# Patient Record
Sex: Female | Born: 1975 | Race: Black or African American | Hispanic: No | Marital: Single | Smoking: Current every day smoker
Health system: Southern US, Community
[De-identification: ages and names within clinical notes are randomized; demographics above are authoritative.]

## PROBLEM LIST (undated history)

## (undated) DIAGNOSIS — L509 Urticaria, unspecified: Secondary | ICD-10-CM

## (undated) DIAGNOSIS — T783XXA Angioneurotic edema, initial encounter: Secondary | ICD-10-CM

## (undated) DIAGNOSIS — L309 Dermatitis, unspecified: Secondary | ICD-10-CM

## (undated) HISTORY — PX: HAMMER TOE SURGERY: SHX385

## (undated) HISTORY — DX: Urticaria, unspecified: L50.9

## (undated) HISTORY — DX: Dermatitis, unspecified: L30.9

## (undated) HISTORY — PX: HERNIA REPAIR: SHX51

## (undated) HISTORY — PX: MYOMECTOMY: SHX85

## (undated) HISTORY — DX: Angioneurotic edema, initial encounter: T78.3XXA

---

## 1999-07-25 ENCOUNTER — Inpatient Hospital Stay (HOSPITAL_COMMUNITY): Admission: AD | Admit: 1999-07-25 | Discharge: 1999-07-25 | Payer: Self-pay | Admitting: *Deleted

## 2002-06-01 ENCOUNTER — Encounter: Admission: RE | Admit: 2002-06-01 | Discharge: 2002-06-01 | Payer: Self-pay | Admitting: Family Medicine

## 2002-06-01 ENCOUNTER — Encounter: Payer: Self-pay | Admitting: Family Medicine

## 2002-08-19 ENCOUNTER — Other Ambulatory Visit: Admission: RE | Admit: 2002-08-19 | Discharge: 2002-08-19 | Payer: Self-pay | Admitting: Family Medicine

## 2002-09-17 ENCOUNTER — Encounter: Payer: Self-pay | Admitting: Family Medicine

## 2002-09-17 ENCOUNTER — Encounter: Admission: RE | Admit: 2002-09-17 | Discharge: 2002-09-17 | Payer: Self-pay | Admitting: Family Medicine

## 2005-06-18 ENCOUNTER — Emergency Department (HOSPITAL_COMMUNITY): Admission: EM | Admit: 2005-06-18 | Discharge: 2005-06-18 | Payer: Self-pay | Admitting: Emergency Medicine

## 2005-08-30 ENCOUNTER — Encounter: Admission: RE | Admit: 2005-08-30 | Discharge: 2005-08-30 | Payer: Self-pay | Admitting: Internal Medicine

## 2012-07-25 ENCOUNTER — Ambulatory Visit: Payer: Self-pay | Admitting: Internal Medicine

## 2012-07-25 LAB — RAPID STREP-A WITH REFLX: Micro Text Report: NEGATIVE

## 2012-07-28 LAB — BETA STREP CULTURE(ARMC)

## 2018-09-18 ENCOUNTER — Other Ambulatory Visit: Payer: Self-pay

## 2018-09-18 ENCOUNTER — Ambulatory Visit: Payer: Managed Care, Other (non HMO) | Admitting: Urgent Care

## 2018-09-18 ENCOUNTER — Encounter: Payer: Self-pay | Admitting: Urgent Care

## 2018-09-18 VITALS — BP 130/76 | HR 61 | Temp 98.3°F | Resp 18 | Ht 65.0 in | Wt 159.6 lb

## 2018-09-18 DIAGNOSIS — J3089 Other allergic rhinitis: Secondary | ICD-10-CM

## 2018-09-18 DIAGNOSIS — H1013 Acute atopic conjunctivitis, bilateral: Secondary | ICD-10-CM | POA: Diagnosis not present

## 2018-09-18 MED ORDER — METHYLPREDNISOLONE ACETATE 80 MG/ML IJ SUSP
80.0000 mg | Freq: Once | INTRAMUSCULAR | Status: AC
  Administered 2018-09-18: 80 mg via INTRAMUSCULAR

## 2018-09-18 MED ORDER — AZELASTINE HCL 0.05 % OP SOLN: 1.0000 [drp] | Freq: Two times a day (BID) | OPHTHALMIC | 12 refills | Status: DC

## 2018-09-18 MED ORDER — LEVOCETIRIZINE DIHYDROCHLORIDE 5 MG PO TABS: 5.0000 mg | ORAL_TABLET | Freq: Every evening | ORAL | 3 refills | Status: DC

## 2018-09-18 NOTE — Progress Notes (Signed)
  MRN: [id number] DOB: 20-Jul-1976  Subjective:   Alexis Santana is a 42 y.o. female presenting for week history of persistent bilateral eye itching, eyelid swelling.  Patient has long-standing history of difficult to control allergies for most of her life.  Has had difficulty with hives in the past but is off the case this time around.  Denies eye pain, eye drainage, fever, sinus pain, ear pain, throat pain, cough.  Patient has recently moved back from [place].  Since she got back she has had severe allergies of her eyes.  She did go to an ophthalmologist that prescribed prednisolone eyedrops.  Patient reports that this did not help.  She continues to take Allegra which is helped tremendously in the past but is not helping this time around.  She does smoke 1 to 2 packs/week.  Has occasional alcohol drink.   Current Outpatient Medications:  .  fexofenadine (ALLEGRA) 180 MG tablet, Take by mouth., Disp: , Rfl:    No Known Allergies  Has past medical history of allergies, anxiety.  History of a myomectomy.  Objective:   Vitals: BP 130/76   Pulse 61   Temp 98.3 F (36.8 C) (Oral)   Resp 18   Ht 5\' 5"  (1.651 m)   Wt 159 lb 9.6 oz (72.4 kg)   LMP 08/22/2018   SpO2 100%   BMI 26.56 kg/m   Physical Exam  Constitutional: She is oriented to person, place, and time. She appears well-developed and well-nourished.  HENT:  Right Ear: Tympanic membrane normal.  Left Ear: Tympanic membrane normal.  Nose: No sinus tenderness.  Mouth/Throat: Oropharynx is clear and moist.  Eyes: Pupils are equal, round, and reactive to light. EOM are normal. Right eye exhibits no chemosis, no discharge, no exudate and no hordeolum. No foreign body present in the right eye. Left eye exhibits no chemosis, no discharge, no exudate and no hordeolum. No foreign body present in the left eye. Right conjunctiva is not injected. Right conjunctiva has no hemorrhage. Left conjunctiva is not injected. Left conjunctiva has no  hemorrhage. No scleral icterus.  Patient has puffy and violaceous eyelids bilaterally.  Cardiovascular: Normal rate.  Pulmonary/Chest: Effort normal.  Neurological: She is alert and oriented to person, place, and time.   Assessment and Plan :   Allergic conjunctivitis of both eyes - Plan: methylPREDNISolone acetate (DEPO-MEDROL) injection 80 mg  Allergic rhinitis due to other allergic trigger, unspecified seasonality - Plan: methylPREDNISolone acetate (DEPO-MEDROL) injection 80 mg  Will use IM Depo-Medrol, antihistamine eyedrop and switch to Xyzal.  Counseled patient on management of persistent allergies.  Return to clinic precautions reviewed.

## 2018-09-18 NOTE — Patient Instructions (Addendum)
Opcon A       If you have lab work done today you will be contacted with your lab results within the next 2 weeks.  If you have not heard from [place] then please contact us. The fastest way to get your results is to register for My Chart.   IF you received an x-ray today, you will receive an invoice from [facility]. Please contact [facility] at [phone] with questions or concerns regarding your invoice.   IF you received labwork today, you will receive an invoice from [place]. Please contact [facility] at [phone] with questions or concerns regarding your invoice.   Our billing staff will not be able to assist you with questions regarding bills from these companies.  You will be contacted with the lab results as soon as they are available. The fastest way to get your results is to activate your My Chart account. Instructions are located on the last page of this paperwork. If you have not heard from [place] regarding the results in 2 weeks, please contact this office.

## 2018-09-19 ENCOUNTER — Telehealth: Payer: Self-pay | Admitting: Urgent Care

## 2018-09-19 NOTE — Telephone Encounter (Unsigned)
Copied from [place] [id number]. Topic: Quick Communication - See Telephone Encounter >> Sep 19, 2018  2:17 PM [name] wrote: CRM for notification. See Telephone encounter for: 09/19/18  Pt called in and stated that she was seen yesterday and stated that she is feeling the exact same.  She seem to think that non of the meds are helping.  She would like to know what else can be done or what is the next step?  Her eyes are still itching and swelling with meds  [place] on [place] number  [phone]

## 2018-10-16 ENCOUNTER — Other Ambulatory Visit: Payer: Self-pay

## 2018-10-16 ENCOUNTER — Encounter: Payer: Self-pay | Admitting: Physician Assistant

## 2018-10-16 ENCOUNTER — Ambulatory Visit: Payer: Managed Care, Other (non HMO) | Admitting: Physician Assistant

## 2018-10-16 ENCOUNTER — Telehealth: Payer: Self-pay | Admitting: Physician Assistant

## 2018-10-16 VITALS — BP 114/66 | HR 69 | Temp 99.0°F | Resp 16 | Ht 65.0 in | Wt 160.2 lb

## 2018-10-16 DIAGNOSIS — H02849 Edema of unspecified eye, unspecified eyelid: Secondary | ICD-10-CM | POA: Diagnosis not present

## 2018-10-16 DIAGNOSIS — J302 Other seasonal allergic rhinitis: Secondary | ICD-10-CM

## 2018-10-16 MED ORDER — DESONIDE 0.05 % EX CREA: TOPICAL_CREAM | Freq: Two times a day (BID) | CUTANEOUS | 0 refills | Status: DC

## 2018-10-16 MED ORDER — MONTELUKAST SODIUM 10 MG PO TABS: 10.0000 mg | ORAL_TABLET | Freq: Every day | ORAL | 6 refills | Status: DC

## 2018-10-16 MED ORDER — PREDNISONE 20 MG PO TABS: ORAL_TABLET | ORAL | 0 refills | Status: DC

## 2018-10-16 NOTE — Patient Instructions (Addendum)
Start taking Singulair.  If no improvemet start the prednisone.  Apply dapsone 1-2 times/day. Do not get this in your eyes. Do not apply for > 2 weeks.   Try claritin-D (you have to buy this from your pharmacist) Try acupuncture [facility] or [place])  Montelukast oral tablets What is this medicine? MONTELUKAST (mon te LOO kast) is used to prevent and treat the symptoms of asthma. It is also used to treat allergies. Do not use for an acute asthma attack. This medicine may be used for other purposes; ask your health care provider or pharmacist if you have questions. COMMON BRAND NAME(S): Singulair What should I tell my health care provider before I take this medicine? They need to know if you have any of these conditions: -liver disease -an unusual or allergic reaction to montelukast, other medicines, foods, dyes, or preservatives -pregnant or trying to get pregnant -breast-feeding How should I use this medicine? This medicine should be given by mouth. Follow the directions on the prescription label. Take this medicine at the same time every day. You may take this medicine with or without meals. Do not chew the tablets. Do not stop taking your medicine unless your doctor tells you to. Talk to your pediatrician regarding the use of this medicine in children. Special care may be needed. While this drug may be prescribed for children as young as 82 years of age for selected conditions, precautions do apply. Overdosage: If you think you have taken too much of this medicine contact a poison control center or emergency room at once. NOTE: This medicine is only for you. Do not share this medicine with others. What if I miss a dose? If you miss a dose, take it as soon as you can. If it is almost time for your next dose, take only that dose. Do not take double or extra doses. What may interact with this medicine? -anti-infectives like rifampin and rifabutin -medicines for diabetes like  rosiglitazone and repaglinide -medicines for seizures like phenytoin, phenobarbital, and carbamazepine -paclitaxel This list may not describe all possible interactions. Give your health care provider a list of all the medicines, herbs, non-prescription drugs, or dietary supplements you use. Also tell them if you smoke, drink alcohol, or use illegal drugs. Some items may interact with your medicine. What should I watch for while using this medicine? Visit your doctor or health care professional for regular checks on your progress. Tell your doctor or health care professional if your allergy or asthma symptoms do not improve. Take your medicine even when you do not have symptoms. Do not stop taking any of your medicine(s) unless your doctor tells you to. If you have asthma, talk to your doctor about what to do in an acute asthma attack. Always have your inhaled rescue medicine for asthma attacks with you. Patients and their families should watch for new or worsening thoughts of suicide or depression. Also watch for sudden changes in feelings such as feeling anxious, agitated, panicky, irritable, hostile, aggressive, impulsive, severely restless, overly excited and hyperactive, or not being able to sleep. Any worsening of mood or thoughts of suicide or dying should be reported to your health care professional right away. What side effects may I notice from receiving this medicine? Side effects that you should report to your doctor or health care professional as soon as possible: -allergic reactions like skin rash or hives, or swelling of the face, lips, or tongue -breathing problems -confusion -dark urine -fever or infection -  flu-like symptoms -hallucinations -painful lumps under the skin -pain, tingling, numbness in the hands or feet -sinus pain or swelling -suicidal thoughts or other mood changes -tremors -trouble sleeping -uncontrolled muscle movements -unusual bleeding or  bruising -yellowing of the eyes or skin Side effects that usually do not require medical attention (report to your doctor or health care professional if they continue or are bothersome): -cough -dizziness -drowsiness -headache -nightmares -stomach upset -stuffy nose This list may not describe all possible side effects. Call your doctor for medical advice about side effects. You may report side effects to FDA at 1-800-FDA-1088. Where should I keep my medicine? Keep out of the reach of children. Store at room temperature between 15 and 30 degrees C (59 and 86 degrees F). Protect from light and moisture. Keep this medicine in the original bottle. Throw away any unused medicine after the expiration date. NOTE: This sheet is a summary. It may not cover all possible information. If you have questions about this medicine, talk to your doctor, pharmacist, or health care provider.  2018 Elsevier/Gold Standard (2015-12-12 09:40:44)   IF you received an x-ray today, you will receive an invoice from [facility]. Please contact [facility] at [phone] with questions or concerns regarding your invoice.   IF you received labwork today, you will receive an invoice from [place]. Please contact [facility] at [phone] with questions or concerns regarding your invoice.   Our billing staff will not be able to assist you with questions regarding bills from these companies.  You will be contacted with the lab results as soon as they are available. The fastest way to get your results is to activate your My Chart account. Instructions are located on the last page of this paperwork. If you have not heard from [place] regarding the results in 2 weeks, please contact this office.

## 2018-10-16 NOTE — Progress Notes (Signed)
   Alexis Santana  MRN: [id number] DOB: 07-22-1976  PCP: Patient, No Pcp Per  Subjective:  Pt is a 42 year old female who presents to clinic for swelling of her eyes x 1 month.   She was here 9/26 c/o of persistent bilateral eye itching, eyelid swelling and dx with allergic conjunctivitis. Depo-medrol IM administered and advised antihistamine eye drops and to switch to Xyzal. None of this worked.   Eyes are still puffy. Puffiness is worse at night before bed and in the morning. She is taking otc antihistamine bid. Not working.   She has h/o difficulty to control allergies.  Historically she has had hives - which has been controlled by Allegra. Allegra no longer working.  Never tried Singulair.  She has allergist appt Nov 21. Never been evaluated by allergy.   Review of Systems  Eyes: Positive for itching. Negative for photophobia, pain, discharge, redness and visual disturbance.  Allergic/Immunologic: Positive for environmental allergies.    There are no active problems to display for this patient.   Current Outpatient Medications on File Prior to Visit  Medication Sig Dispense Refill  . fexofenadine (ALLEGRA) 180 MG tablet Take by mouth.    . levocetirizine (XYZAL) 5 MG tablet Take 1 tablet (5 mg total) by mouth every evening. 90 tablet 3  . azelastine (OPTIVAR) 0.05 % ophthalmic solution Place 1 drop into both eyes 2 (two) times daily. (Patient not taking: Reported on 10/16/2018) 6 mL 12   No current facility-administered medications on file prior to visit.     No Known Allergies   Objective:  BP 114/66 (BP Location: Left Arm, Patient Position: Sitting, Cuff Size: Normal)   Pulse 69   Temp 99 F (37.2 C) (Oral)   Resp 16   Ht 5\' 5"  (1.651 m)   Wt 160 lb 3.2 oz (72.7 kg)   SpO2 100%   BMI 26.66 kg/m   Physical Exam  Constitutional: She appears well-developed and well-nourished.  Eyes:  Dry scaly skin of b/l eyelids and skin below eyebrows. L>R.  Skin: Skin is  warm and dry.  Psychiatric: She has a normal mood and affect. Thought content normal.  Vitals reviewed.   Assessment and Plan :  1. Seasonal allergies - montelukast (SINGULAIR) 10 MG tablet; Take 1 tablet (10 mg total) by mouth at bedtime.  Dispense: 30 tablet; Refill: 6  2. Edema of eyelid, unspecified laterality - predniSONE (DELTASONE) 20 MG tablet; Take 3 PO QAM x2days, 2 PO QAM x2days, 1 PO QAM x2days  Dispense: 18 tablet; Refill: 0 - desonide (DESOWEN) 0.05 % cream; Apply topically 2 (two) times daily.  Dispense: 30 g; Refill: 0    Whitney Rozelle Caudle, PA-C  [facility] 10/16/2018 4:28 PM  Please note: Portions of this report may have been transcribed using dragon voice recognition software. Every effort was made to ensure accuracy; however, inadvertent computerized transcription errors may be present.

## 2018-10-16 NOTE — Telephone Encounter (Signed)
Copied from [place] [id number]. Topic: Quick Communication - See Telephone Encounter >> Oct 16, 2018  5:11 PM Blase Mess A wrote: CRM for notification. See Telephone encounter for: 10/16/18.  [place] [phone] is calling regarding predniSONE (DELTASONE) 20 MG tablet [[id number]] regarding the instructions on how to take the medicine. Please advise thanks

## 2018-10-16 NOTE — Telephone Encounter (Signed)
Pt called in today, she says she would like to have a call back, because no one followed up with her about her previous request sent on 09/19/18.

## 2018-10-17 NOTE — Telephone Encounter (Signed)
Call from pharmacy re Prednisone.  Titration ordered equals #12 but #18 ordered.  Standard titration for this dx is 3 PO x 3 days, 2 PO x 2 days, 1 PO x 3 days which equals #18.  Ok'ed change to standard titration per pharmacy recommendation.

## 2018-10-30 ENCOUNTER — Ambulatory Visit: Payer: Managed Care, Other (non HMO) | Admitting: Allergy & Immunology

## 2018-11-13 ENCOUNTER — Encounter: Payer: Self-pay | Admitting: Allergy

## 2018-11-13 ENCOUNTER — Ambulatory Visit (INDEPENDENT_AMBULATORY_CARE_PROVIDER_SITE_OTHER): Payer: 59 | Admitting: Allergy

## 2018-11-13 VITALS — BP 104/64 | HR 72 | Temp 99.2°F | Resp 18 | Ht 64.5 in | Wt 160.0 lb

## 2018-11-13 DIAGNOSIS — T783XXD Angioneurotic edema, subsequent encounter: Secondary | ICD-10-CM | POA: Diagnosis not present

## 2018-11-13 DIAGNOSIS — L2089 Other atopic dermatitis: Secondary | ICD-10-CM

## 2018-11-13 DIAGNOSIS — L508 Other urticaria: Secondary | ICD-10-CM

## 2018-11-13 MED ORDER — CARBINOXAMINE MALEATE 6 MG PO TABS: 1.0000 | ORAL_TABLET | Freq: Two times a day (BID) | ORAL | 5 refills | Status: DC

## 2018-11-13 MED ORDER — FAMOTIDINE 20 MG PO TABS: 20.0000 mg | ORAL_TABLET | Freq: Two times a day (BID) | ORAL | 5 refills | Status: DC

## 2018-11-13 NOTE — Progress Notes (Signed)
New Patient Note  RE: Alexis Santana MRN: [id number] DOB: 04-Aug-1976 Date of Office Visit: 11/13/2018  Referring provider: No ref. provider found Primary care provider: Patient, No Pcp Per  Chief Complaint: hives  History of present illness: Alexis Santana is a 42 y.o. female presenting today for evaluation of hives.   She reports having hives and swelling for 20+ years.  She states her mother reported to her she would have hives as a child.  She also reports having eye and facial swelling episodes.  She recalls in her 15s she would have near daily episodes of lip and eye swelling.  At that time she was taking prescription Allegra until it went OTC.  Allegra was working well for her in controlling hives and swelling.  She states around 2010 she lived in [place] and she was having swelling episodes while there.  2011 she moved to [place] and continued to have swelling episodes even during colder weather; states previously the hives and swelling would occur mainly during warmer weather months.   She moved to [place] this fall and is living in a home built in the 1940s and states the hives have returned and occur almost daily for weeks at a time.  She has been hive free for the past 4-5 days.    The hives last less then 24 hours at a time and if she takes antihistamine at onset hives are resolving in 2 hours or less.  Hives do not leave any marks/bruising once resolved.  Denies any fevers or joint aches/pains with hives and swelling.  Does not recall any preceding illnesses, no change in medications, no stings, no change soaps/lotions/detergents.  She reports foods have never worsened or triggered symptoms but she does recently state after eating shrimp her mouth felt itchy.    Since moving back to [place] she does feel that the Allegra has not been effective as it use to be.  She saw UC for eye swelling and was recommended to try Xyzal which she does not feel helped.  Also prescribed singulair which she  does feel has helped.  She also received steroid injection at [facility] in [facility].    She does report history of eczema that occurs mostly during winter.  She does have clobetasol to use as needed which she states is the only topical steroid that works for her.    No history of food allergy, no history of asthma and denies symptoms for allergic rhinitis or conjunctivitis.    Review of systems: Review of Systems  Constitutional: Negative for chills, fever and malaise/fatigue.  HENT: Negative for congestion, ear discharge, nosebleeds and sore throat.   Eyes: Negative for pain, discharge and redness.  Respiratory: Negative for cough, shortness of breath and wheezing.   Cardiovascular: Negative for chest pain.  Gastrointestinal: Negative for abdominal pain, constipation, diarrhea, heartburn, nausea and vomiting.  Musculoskeletal: Negative for joint pain.  Skin: Positive for itching and rash.  Neurological: Negative for headaches.    All other systems negative unless noted above in HPI  Past medical history: Past Medical History:  Diagnosis Date  . Angio-edema   . Eczema   . Urticaria     Past surgical history: Past Surgical History:  Procedure Laterality Date  . HAMMER TOE SURGERY    . HERNIA REPAIR    . MYOMECTOMY      Family history:  Family History  Problem Relation Age of Onset  . Hypertension Mother   . Cancer  Mother        breast   . Diabetes Father     Social history: Lives in a home but states will be moving to [place].  The home has carpeting with electric heating and central cooling. No pets in the home.  She works in administration.   Tobacco Use  . Smoking status: Current Every Day Smoker - menthol 1 pk/week since 2005  . Smokeless tobacco: Never Used    Medication List: Allergies as of 11/13/2018   No Known Allergies     Medication List        Accurate as of 11/13/18 11:17 AM. Always use your most recent med list.          levocetirizine 5  MG tablet Commonly known as:  XYZAL Take 1 tablet (5 mg total) by mouth every evening.   montelukast 10 MG tablet Commonly known as:  SINGULAIR Take 1 tablet (10 mg total) by mouth at bedtime.       Known medication allergies: No Known Allergies   Physical examination: Blood pressure 104/64, pulse 72, temperature 99.2 F (37.3 C), temperature source Oral, resp. rate 18, height 5' 4.5" (1.638 m), weight 160 lb (72.6 kg), SpO2 99 %.  General: Alert, interactive, in no acute distress. HEENT: PERRLA, TMs pearly gray, turbinates non-edematous without discharge, post-pharynx non erythematous. Neck: Supple without lymphadenopathy. Lungs: Clear to auscultation without wheezing, rhonchi or rales. {no increased work of breathing. CV: Normal S1, S2 without murmurs. Abdomen: Nondistended, nontender. Skin: Warm and dry, without lesions or rashes. Extremities:  No clubbing, cyanosis or edema. Neuro:   Grossly intact.  Diagnositics/Labs: None today  Assessment and plan: Chronic urticaria with angioedema  - at this time etiology of hives and swelling is unknown.  Hives can be caused by a variety of different triggers including illness/infection, foods, medications, stings, exercise, pressure, vibrations, extremes of temperature to name a few however majority of the time there is no identifiable trigger.  Your symptoms have been ongoing for >6 weeks making this chronic thus will obtain labwork to evaluate: CBC w diff, CMP, tryptase, hive panel, environmental panel and shellfish panel.   - recommend starting high-dose antihistamine regimen: Ryvent 6mg  1 tablet twice a day (this is antihistamine like Allegra); Pepcid 20mg  1 tablet twice a day and continue Singulair 10mg  1 tablet daily  - we did discuss Xolair monthly injections to help control hives and swelling better if medication management is not effective.  Provided with brochure for Xolair  - let us know if you develop any fevers with hives  and swelling; if hives and swelling leave any marks/bruising behind once resolved or if you have any joint aches/pains with hives and swelling  Eczema  - continue as needed use of clobetasol with flares  - daily moisturization  Follow-up 2-3 months or sooner if needed  I appreciate the opportunity to take part in Alexis Santana's care. Please do not hesitate to contact me with questions.  Sincerely,   Prudy Feeler, MD Allergy/Immunology [facility]

## 2018-11-13 NOTE — Patient Instructions (Addendum)
Hives and swelling  - at this time etiology of hives and swelling is unknown.  Hives can be caused by a variety of different triggers including illness/infection, foods, medications, stings, exercise, pressure, vibrations, extremes of temperature to name a few however majority of the time there is no identifiable trigger.  Your symptoms have been ongoing for >6 weeks making this chronic thus will obtain labwork to evaluate: CBC w diff, CMP, tryptase, hive panel, environmental panel and shellfish panel.   - recommend starting high-dose antihistamine regimen: Ryvent 6mg  1 tablet twice a day (this is antihistamine like Allegra); Pepcid 20mg  1 tablet twice a day and continue Singulair 10mg  1 tablet daily  - we did discuss Xolair monthly injections to help control hives and swelling better if medication management is not effective.  Provided with brochure for Xolair  - let us know if you develop any fevers with hives and swelling; if hives and swelling leave any marks/bruising behind once resolved or if you have any joint aches/pains with hives and swelling  Eczema  - continue as needed use of clobetasol with flares  - daily moisturization  Follow-up 2-3 months or sooner if needed

## 2018-11-19 LAB — COMPREHENSIVE METABOLIC PANEL
ALT: 8 IU/L (ref 0–32)
AST: 15 IU/L (ref 0–40)
Albumin/Globulin Ratio: 2.1 (ref 1.2–2.2)
Albumin: 4.7 g/dL (ref 3.5–5.5)
Alkaline Phosphatase: 53 IU/L (ref 39–117)
BUN/Creatinine Ratio: 19 (ref 9–23)
BUN: 13 mg/dL (ref 6–24)
Bilirubin Total: 0.3 mg/dL (ref 0.0–1.2)
CALCIUM: 9.4 mg/dL (ref 8.7–10.2)
CO2: 21 mmol/L (ref 20–29)
CREATININE: 0.7 mg/dL (ref 0.57–1.00)
Chloride: 101 mmol/L (ref 96–106)
GFR calc Af Amer: 124 mL/min/{1.73_m2} (ref >59)
GFR, EST NON AFRICAN AMERICAN: 107 mL/min/{1.73_m2} (ref >59)
GLUCOSE: 93 mg/dL (ref 65–99)
Globulin, Total: 2.2 g/dL (ref 1.5–4.5)
Potassium: 4.9 mmol/L (ref 3.5–5.2)
Sodium: 137 mmol/L (ref 134–144)
Total Protein: 6.9 g/dL (ref 6.0–8.5)

## 2018-11-19 LAB — ALLERGENS W/TOTAL IGE AREA 2
Alternaria Alternata IgE: <0.1 kU/L
Cat Dander IgE: <0.1 kU/L
Common Silver Birch IgE: <0.1 kU/L
D Pteronyssinus IgE: 0.19 kU/L — AB
D002-IGE D FARINAE: 0.15 kU/L — AB
Dog Dander IgE: <0.1 kU/L
Elm, American IgE: <0.1 kU/L
IGE (IMMUNOGLOBULIN E), SERUM: 84 [IU]/mL (ref 6–495)
Johnson Grass IgE: <0.1 kU/L
Maple/Box Elder IgE: <0.1 kU/L
Mouse Urine IgE: <0.1 kU/L
Oak, White IgE: <0.1 kU/L
White Mulberry IgE: <0.1 kU/L

## 2018-11-19 LAB — CBC WITH DIFFERENTIAL/PLATELET
BASOS ABS: 0 10*3/uL (ref 0.0–0.2)
Basos: 1 %
EOS (ABSOLUTE): 0 10*3/uL (ref 0.0–0.4)
Eos: 1 %
Hematocrit: 34.7 % (ref 34.0–46.6)
Hemoglobin: 11.2 g/dL (ref 11.1–15.9)
IMMATURE GRANULOCYTES: 0 %
Immature Grans (Abs): 0 10*3/uL (ref 0.0–0.1)
LYMPHS ABS: 1.8 10*3/uL (ref 0.7–3.1)
Lymphs: 34 %
MCH: 27.3 pg (ref 26.6–33.0)
MCHC: 32.3 g/dL (ref 31.5–35.7)
MCV: 85 fL (ref 79–97)
MONOCYTES: 9 %
Monocytes Absolute: 0.5 10*3/uL (ref 0.1–0.9)
NEUTROS PCT: 55 %
Neutrophils Absolute: 2.9 10*3/uL (ref 1.4–7.0)
Platelets: 450 10*3/uL (ref 150–450)
RBC: 4.1 x10E6/uL (ref 3.77–5.28)
RDW: 14 % (ref 12.3–15.4)
WBC: 5.2 10*3/uL (ref 3.4–10.8)

## 2018-11-19 LAB — ALLERGEN PROFILE, SHELLFISH
F290-IgE Oyster: <0.1 kU/L
Scallop IgE: <0.1 kU/L

## 2018-11-19 LAB — TRYPTASE: TRYPTASE: 5.2 ug/L (ref 2.2–13.2)

## 2018-11-19 LAB — CHRONIC URTICARIA: cu index: 14.8 — ABNORMAL HIGH (ref <10)

## 2018-11-19 LAB — THYROID ANTIBODIES
THYROID PEROXIDASE ANTIBODY: 16 [IU]/mL (ref 0–34)
Thyroglobulin Antibody: <1 IU/mL (ref 0.0–0.9)

## 2019-07-28 ENCOUNTER — Other Ambulatory Visit: Payer: Self-pay

## 2019-07-28 ENCOUNTER — Encounter: Payer: Self-pay | Admitting: Registered Nurse

## 2019-07-28 ENCOUNTER — Ambulatory Visit (INDEPENDENT_AMBULATORY_CARE_PROVIDER_SITE_OTHER): Payer: 59 | Admitting: Registered Nurse

## 2019-07-28 VITALS — BP 117/72 | HR 66 | Temp 98.8°F | Resp 16 | Ht 64.76 in | Wt 148.0 lb

## 2019-07-28 DIAGNOSIS — M25521 Pain in right elbow: Secondary | ICD-10-CM | POA: Diagnosis not present

## 2019-07-28 MED ORDER — MELOXICAM 7.5 MG PO TABS: 7.5000 mg | ORAL_TABLET | Freq: Every day | ORAL | 0 refills | Status: DC

## 2019-07-28 NOTE — Patient Instructions (Signed)
° ° ° °  If you have lab work done today you will be contacted with your lab results within the next 2 weeks.  If you have not heard from us then please contact us. The fastest way to get your results is to register for My Chart. ° ° °IF you received an x-ray today, you will receive an invoice from [facility]. Please contact [facility] at [phone] with questions or concerns regarding your invoice.  ° °IF you received labwork today, you will receive an invoice from [facility]. Please contact [facility] at [phone] with questions or concerns regarding your invoice.  ° °Our billing staff will not be able to assist you with questions regarding bills from these companies. ° °You will be contacted with the lab results as soon as they are available. The fastest way to get your results is to activate your My Chart account. Instructions are located on the last page of this paperwork. If you have not heard from us regarding the results in 2 weeks, please contact this office. °  ° ° ° °

## 2019-07-28 NOTE — Progress Notes (Signed)
Established Patient Office Visit  Subjective:  Patient ID: Alexis Santana, female    DOB: 02-May-1976  Age: 43 y.o. MRN: [id number]  CC:  Chief Complaint  Patient presents with   Elbow Pain    pt states she has been having pain in her right elbow x 5 weeks, more painful with movement    HPI Avon S Lascano presents for R elbow pain Onset 5+ weeks prior to visit, located in lateral portion of elbow in muscle tissue and anterior portion of elbow near distal biceps tendon. Has been intermittent. Has started to interfere with daily activities. Relieved with NSAIDs. Worsened by movement.  Past Medical History:  Diagnosis Date   Angio-edema    Eczema    Urticaria     Past Surgical History:  Procedure Laterality Date   HAMMER TOE SURGERY     HERNIA REPAIR     MYOMECTOMY      Family History  Problem Relation Age of Onset   Hypertension Mother    Cancer Mother        breast    Diabetes Father     Social History   Socioeconomic History   Marital status: Single    Spouse name: Not on file   Number of children: Not on file   Years of education: Not on file   Highest education level: Not on file  Occupational History   Not on file  Social Needs   Financial resource strain: Not on file   Food insecurity    Worry: Not on file    Inability: Not on file   Transportation needs    Medical: Not on file    Non-medical: Not on file  Tobacco Use   Smoking status: Current Every Day Smoker    Packs/day: 0.25    Years: 9.00    Pack years: 2.25    Types: Cigarettes   Smokeless tobacco: Never Used  Substance and Sexual Activity   Alcohol use: Yes    Alcohol/week: 2.0 standard drinks    Types: 2 Shots of liquor per week   Drug use: Never   Sexual activity: Not on file  Lifestyle   Physical activity    Days per week: Not on file    Minutes per session: Not on file   Stress: Not on file  Relationships   Social connections    Talks on phone: Not  on file    Gets together: Not on file    Attends religious service: Not on file    Active member of club or organization: Not on file    Attends meetings of clubs or organizations: Not on file    Relationship status: Not on file   Intimate partner violence    Fear of current or ex partner: Not on file    Emotionally abused: Not on file    Physically abused: Not on file    Forced sexual activity: Not on file  Other Topics Concern   Not on file  Social History Narrative   Not on file    Outpatient Medications Prior to Visit  Medication Sig Dispense Refill   Carbinoxamine Maleate (RYVENT) 6 MG TABS Take 1 tablet by mouth 2 (two) times daily. 60 tablet 5   famotidine (PEPCID) 20 MG tablet Take 1 tablet (20 mg total) by mouth 2 (two) times daily. 60 tablet 5   levocetirizine (XYZAL) 5 MG tablet Take 1 tablet (5 mg total) by mouth every evening. 90 tablet 3  montelukast (SINGULAIR) 10 MG tablet Take 1 tablet (10 mg total) by mouth at bedtime. 30 tablet 6   No facility-administered medications prior to visit.     No Known Allergies  ROS Review of Systems  per hpi    Objective:    Physical Exam  Constitutional: She is oriented to person, place, and time. She appears well-developed and well-nourished. No distress.  Cardiovascular: Normal rate and regular rhythm.  Pulmonary/Chest: Effort normal. No respiratory distress.  Musculoskeletal: Normal range of motion.        General: Tenderness present. No deformity or edema.     Right elbow: She exhibits normal range of motion, no swelling, no effusion, no deformity and no laceration. Tenderness found. Lateral epicondyle tenderness noted.     Comments: Strength 5/5 bilat upper extremities.  Tenderness elicited on palpation, not elicited through passive or active ROM.   Neurological: She is alert and oriented to person, place, and time.  Skin: Skin is warm and dry. No rash noted. She is not diaphoretic. No erythema. No pallor.    Psychiatric: She has a normal mood and affect. Her behavior is normal. Judgment and thought content normal.    BP 117/72    Pulse 66    Temp 98.8 F (37.1 C) (Oral)    Resp 16    Ht 5' 4.76" (1.645 m)    Wt 148 lb (67.1 kg)    LMP 07/26/2019 (Approximate)    SpO2 98%    BMI 24.81 kg/m  Wt Readings from Last 3 Encounters:  07/28/19 148 lb (67.1 kg)  11/13/18 160 lb (72.6 kg)  10/16/18 160 lb 3.2 oz (72.7 kg)     Health Maintenance Due  Topic Date Due   HIV Screening  06/09/1991   TETANUS/TDAP  06/09/1995   PAP SMEAR-Modifier  06/08/1997   INFLUENZA VACCINE  07/25/2019    There are no preventive care reminders to display for this patient.  No results found for: TSH Lab Results  Component Value Date   WBC 5.2 11/13/2018   HGB 11.2 11/13/2018   HCT 34.7 11/13/2018   MCV 85 11/13/2018   PLT 450 11/13/2018   Lab Results  Component Value Date   NA 137 11/13/2018   K 4.9 11/13/2018   CO2 21 11/13/2018   GLUCOSE 93 11/13/2018   BUN 13 11/13/2018   CREATININE 0.70 11/13/2018   BILITOT 0.3 11/13/2018   ALKPHOS 53 11/13/2018   AST 15 11/13/2018   ALT 8 11/13/2018   PROT 6.9 11/13/2018   ALBUMIN 4.7 11/13/2018   CALCIUM 9.4 11/13/2018   No results found for: CHOL No results found for: HDL No results found for: LDLCALC No results found for: TRIG No results found for: CHOLHDL No results found for: HGBA1C    Assessment & Plan:   Problem List Items Addressed This Visit    None      No orders of the defined types were placed in this encounter.   Follow-up: No follow-ups on file.   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 Likely continues to agitate during activity or while sleeping, NSAIDs beneficial.  Would benefit from PT  Meloxicam 7.5 mg PO qpm to help with sleep  Patient encouraged to call clinic with any questions, comments, or concerns.   Maximiano Coss, NP

## 2019-08-27 ENCOUNTER — Telehealth: Payer: Self-pay | Admitting: Registered Nurse

## 2019-08-27 NOTE — Telephone Encounter (Signed)
Copied from [place] [id number]. Topic: Referral - Question >> Aug 27, 2019 12:38 PM Burchel, Abbi R wrote: Pt would like to have PT referral sent to a facility closer to [address]/[place] area.  Please advise.    [phone]

## 2019-08-27 NOTE — Telephone Encounter (Signed)
Copied from [place] [phone]. Topic: Referral - Question >> Aug 27, 2019 12:38 PM Burchel, Abbi R wrote: Pt would like to have PT referral sent to a facility closer to [address]/[place] area.  Please advise.    [phone]

## 2019-09-22 NOTE — Telephone Encounter (Signed)
Please accomodate location if possible.

## 2020-10-26 ENCOUNTER — Other Ambulatory Visit: Payer: Self-pay | Admitting: Obstetrics and Gynecology

## 2020-10-26 DIAGNOSIS — D259 Leiomyoma of uterus, unspecified: Secondary | ICD-10-CM

## 2021-02-23 ENCOUNTER — Encounter: Payer: Self-pay | Admitting: *Deleted

## 2021-02-23 ENCOUNTER — Ambulatory Visit
Admission: RE | Admit: 2021-02-23 | Discharge: 2021-02-23 | Disposition: A | Discharge status: Home / Self Care | Payer: Self-pay | Source: Ambulatory Visit | Attending: Obstetrics and Gynecology | Admitting: Obstetrics and Gynecology

## 2021-02-23 DIAGNOSIS — D259 Leiomyoma of uterus, unspecified: Secondary | ICD-10-CM

## 2021-02-23 HISTORY — PX: IR RADIOLOGIST EVAL & MGMT: IMG5224

## 2021-02-23 NOTE — Consult Note (Signed)
Chief Complaint: Patient was seen in consultation today for uterine fibroids and menorrhagia at the request of [name],[name]  Referring Physician(s): [name],[name]  History of Present Illness: Alexis Santana is a 45 y.o. female with history of uterine fibroids and heavy menstrual bleeding.  Patient reports having a myomectomy in either 2017 or 2018 due to heavy menstrual bleeding and anterior abdominal pain.  She reports minimal improvement in her symptoms following the myomectomy.  After the myomectomy, the patient underwent abdominal hernia repair and was told that she has endometriosis after this hernia repair.  She had mesh placed for this abdominal hernia.  Patient says that the point tenderness in her anterior abdomen has improved.  However she continues to have very heavy menstrual bleeding and significant pain and cramping during the bleeding.  She reports 2 to 7 days of very heavy bleeding.  During the heavy days of bleeding, she may be changing pads every 1-3 hours.  Patient had pelvic ultrasound on 10/13/2020 that demonstrates a well-circumscribed mass in endometrial cavity measuring 20.3 x 15.4 x 18.9 mm.  This is suspicious for a submucosal fibroid.  Pregnancy history is G0.  Patient has no intentions of future pregnancies.  Patient works from home.  Patient has been on oral contraceptive pills in the past but she is currently not taking any medicine.  Past Medical History:  Diagnosis Date  . Angio-edema   . Eczema   . Urticaria     Past Surgical History:  Procedure Laterality Date  . HAMMER TOE SURGERY    . HERNIA REPAIR    . IR RADIOLOGIST EVAL & MGMT  02/23/2021  . MYOMECTOMY      Allergies: Patient has no known allergies.  Medications: Prior to Admission medications   Medication Sig Start Date End Date Taking? Authorizing Provider  meloxicam (MOBIC) 7.5 MG tablet Take 1 tablet (7.5 mg total) by mouth daily. 07/28/19   Maximiano Coss, NP     Family  History  Problem Relation Age of Onset  . Hypertension Mother   . Cancer Mother        breast   . Diabetes Father     Social History   Socioeconomic History  . Marital status: Single    Spouse name: Not on file  . Number of children: Not on file  . Years of education: Not on file  . Highest education level: Not on file  Occupational History  . Not on file  Tobacco Use  . Smoking status: Current Every Day Smoker    Packs/day: 0.25    Years: 9.00    Pack years: 2.25    Types: Cigarettes  . Smokeless tobacco: Never Used  Vaping Use  . Vaping Use: Never used  Substance and Sexual Activity  . Alcohol use: Yes    Alcohol/week: 2.0 standard drinks    Types: 2 Shots of liquor per week  . Drug use: Never  . Sexual activity: Not on file  Other Topics Concern  . Not on file  Social History Narrative  . Not on file   Social Determinants of Health   Financial Resource Strain: Not on file  Food Insecurity: Not on file  Transportation Needs: Not on file  Physical Activity: Not on file  Stress: Not on file  Social Connections: Not on file     Review of Systems  Constitutional: Negative.   Gastrointestinal: Positive for abdominal pain.  Genitourinary: Positive for menstrual problem and vaginal bleeding.  Vital Signs: There were no vitals taken for this visit.  Physical Exam Constitutional:      Appearance: Normal appearance.  Cardiovascular:     Rate and Rhythm: Normal rate and regular rhythm.     Pulses: Normal pulses.     Heart sounds: Normal heart sounds.  Pulmonary:     Effort: Pulmonary effort is normal.     Breath sounds: Normal breath sounds.  Abdominal:     General: Abdomen is flat.     Palpations: Abdomen is soft.  Neurological:     Mental Status: She is alert.        Imaging: IR Radiologist Eval & Mgmt  Result Date: 02/23/2021 Please refer to notes tab for details about interventional procedure. (Op Note)   Labs:  CBC: No results for  input(s): WBC, HGB, HCT, PLT in the last 8760 hours.  COAGS: No results for input(s): INR, APTT in the last 8760 hours.  BMP: No results for input(s): NA, K, CL, CO2, GLUCOSE, BUN, CALCIUM, CREATININE, GFRNONAA, GFRAA in the last 8760 hours.  Invalid input(s): CMP  LIVER FUNCTION TESTS: No results for input(s): BILITOT, AST, ALT, ALKPHOS, PROT, ALBUMIN in the last 8760 hours.  TUMOR MARKERS: No results for input(s): AFPTM, CEA, CA199, CHROMGRNA in the last 8760 hours.  Assessment and Plan:  45 year old with history of uterine fibroids, menorrhagia and previous myomectomy.  Patient may also have endometriosis.  Patient symptoms of heavy menstrual bleeding with pain would be consistent with fibroid disease.  Prior ultrasound suggested that the patient has a large submucosal fibroid.  We discussed uterine fibroids and uterine artery embolization procedure in depth.  I believe the patient has a very good understanding of the risks and benefits of the procedure.  Risks include bleeding, infection and vascular injury.  Explained to patient that we would need to evaluate her uterus and fibroids with a pelvic MRI prior to endovascular treatment.  Patient is interested in the uterine fibroid embolization and would like to schedule a pelvic MRI.  Plan for pelvic MRI with and without contrast.  Patient is a good candidate for uterine artery embolization procedure based on her symptoms assuming that there is no contraindication based on the pelvic MRI.  We will contact the patient after the pelvic MRI.  Thank you for this interesting consult.  I greatly enjoyed meeting [name] and look forward to participating in their care.  A copy of this report was sent to the requesting provider on this date.  Electronically Signed: Burman Riis 02/23/2021, 4:01 PM   I spent a total of  30 Minutes   in face to face in clinical consultation, greater than 50% of which was counseling/coordinating care for  uterine fibroids and menorrhagia.

## 2021-02-24 ENCOUNTER — Other Ambulatory Visit: Payer: Self-pay | Admitting: Diagnostic Radiology

## 2021-02-24 DIAGNOSIS — D250 Submucous leiomyoma of uterus: Secondary | ICD-10-CM

## 2021-03-08 ENCOUNTER — Ambulatory Visit (HOSPITAL_COMMUNITY): Admission: RE | Admit: 2021-03-08 | Payer: Federal, State, Local not specified - PPO | Source: Ambulatory Visit

## 2021-03-13 ENCOUNTER — Ambulatory Visit (HOSPITAL_COMMUNITY)
Admission: RE | Admit: 2021-03-13 | Discharge: 2021-03-13 | Disposition: A | Discharge status: Home / Self Care | Payer: Federal, State, Local not specified - PPO | Source: Ambulatory Visit | Attending: Diagnostic Radiology | Admitting: Diagnostic Radiology

## 2021-03-13 ENCOUNTER — Other Ambulatory Visit: Payer: Self-pay

## 2021-03-13 DIAGNOSIS — D250 Submucous leiomyoma of uterus: Secondary | ICD-10-CM | POA: Insufficient documentation

## 2021-03-13 MED ORDER — GADOBUTROL 1 MMOL/ML IV SOLN
7.0000 mL | Freq: Once | INTRAVENOUS | Status: AC | PRN
  Administered 2021-03-13: 7 mL via INTRAVENOUS

## 2021-03-23 ENCOUNTER — Other Ambulatory Visit (HOSPITAL_COMMUNITY): Payer: Self-pay | Admitting: Diagnostic Radiology

## 2021-03-23 DIAGNOSIS — D259 Leiomyoma of uterus, unspecified: Secondary | ICD-10-CM

## 2021-04-04 ENCOUNTER — Other Ambulatory Visit (HOSPITAL_COMMUNITY)
Admission: RE | Admit: 2021-04-04 | Discharge: 2021-04-04 | Disposition: A | Discharge status: Home / Self Care | Payer: Federal, State, Local not specified - PPO | Source: Ambulatory Visit | Attending: Diagnostic Radiology | Admitting: Diagnostic Radiology

## 2021-04-04 DIAGNOSIS — Z01812 Encounter for preprocedural laboratory examination: Secondary | ICD-10-CM | POA: Insufficient documentation

## 2021-04-04 DIAGNOSIS — U071 COVID-19: Secondary | ICD-10-CM | POA: Diagnosis not present

## 2021-04-04 LAB — SARS CORONAVIRUS 2 (TAT 6-24 HRS): SARS Coronavirus 2: POSITIVE — AB

## 2021-04-05 ENCOUNTER — Encounter: Payer: Self-pay | Admitting: Diagnostic Radiology

## 2021-04-05 ENCOUNTER — Telehealth (HOSPITAL_COMMUNITY): Payer: Self-pay

## 2021-04-05 NOTE — Telephone Encounter (Signed)
03/26/21 - Contacted Dr. Adan Sis. Anselm Pancoast - spoke to [place] - advised of patient's Positive Covid19 lab results. MBM

## 2021-04-05 NOTE — Progress Notes (Signed)
Patient ID: Alexis Santana, female   DOB: [birth year], 45 y.o.   MRN: [id number] Patient originally scheduled for uterine fibroid embolization on 04/06/21.  Recent COVID-19 test returned back positive today.  Patient notified of results.  Procedure has been rescheduled for 4/28.

## 2021-04-06 ENCOUNTER — Ambulatory Visit (HOSPITAL_COMMUNITY): Payer: Federal, State, Local not specified - PPO

## 2021-04-06 ENCOUNTER — Other Ambulatory Visit (HOSPITAL_COMMUNITY): Payer: Federal, State, Local not specified - PPO

## 2021-04-18 ENCOUNTER — Other Ambulatory Visit: Payer: Self-pay | Admitting: Student

## 2021-04-19 ENCOUNTER — Other Ambulatory Visit: Payer: Self-pay | Admitting: Radiology

## 2021-04-19 ENCOUNTER — Other Ambulatory Visit: Payer: Self-pay | Admitting: Student

## 2021-04-20 ENCOUNTER — Other Ambulatory Visit (HOSPITAL_COMMUNITY): Payer: Self-pay | Admitting: Diagnostic Radiology

## 2021-04-20 ENCOUNTER — Observation Stay (HOSPITAL_COMMUNITY)
Admission: RE | Admit: 2021-04-20 | Discharge: 2021-04-21 | Disposition: A | Discharge status: Home / Self Care | Payer: Federal, State, Local not specified - PPO | Source: Ambulatory Visit | Attending: Diagnostic Radiology | Admitting: Diagnostic Radiology

## 2021-04-20 ENCOUNTER — Other Ambulatory Visit: Payer: Self-pay

## 2021-04-20 ENCOUNTER — Ambulatory Visit (HOSPITAL_COMMUNITY)
Admission: RE | Admit: 2021-04-20 | Discharge: 2021-04-20 | Disposition: A | Discharge status: Home / Self Care | Payer: Federal, State, Local not specified - PPO | Source: Ambulatory Visit | Attending: Diagnostic Radiology | Admitting: Diagnostic Radiology

## 2021-04-20 ENCOUNTER — Encounter (HOSPITAL_COMMUNITY): Payer: Self-pay

## 2021-04-20 DIAGNOSIS — Z803 Family history of malignant neoplasm of breast: Secondary | ICD-10-CM | POA: Insufficient documentation

## 2021-04-20 DIAGNOSIS — Z791 Long term (current) use of non-steroidal anti-inflammatories (NSAID): Secondary | ICD-10-CM | POA: Diagnosis not present

## 2021-04-20 DIAGNOSIS — N920 Excessive and frequent menstruation with regular cycle: Secondary | ICD-10-CM | POA: Diagnosis not present

## 2021-04-20 DIAGNOSIS — D259 Leiomyoma of uterus, unspecified: Principal | ICD-10-CM | POA: Insufficient documentation

## 2021-04-20 DIAGNOSIS — N924 Excessive bleeding in the premenopausal period: Secondary | ICD-10-CM | POA: Diagnosis not present

## 2021-04-20 DIAGNOSIS — F17210 Nicotine dependence, cigarettes, uncomplicated: Secondary | ICD-10-CM | POA: Insufficient documentation

## 2021-04-20 DIAGNOSIS — N942 Vaginismus: Secondary | ICD-10-CM | POA: Diagnosis not present

## 2021-04-20 HISTORY — PX: IR US GUIDE VASC ACCESS RIGHT: IMG2390

## 2021-04-20 HISTORY — PX: IR ANGIOGRAM SELECTIVE EACH ADDITIONAL VESSEL: IMG667

## 2021-04-20 HISTORY — PX: IR US GUIDE VASC ACCESS LEFT: IMG2389

## 2021-04-20 HISTORY — PX: IR EMBO TUMOR ORGAN ISCHEMIA INFARCT INC GUIDE ROADMAPPING: IMG5449

## 2021-04-20 HISTORY — PX: IR ANGIOGRAM PELVIS SELECTIVE OR SUPRASELECTIVE: IMG661

## 2021-04-20 LAB — CBC WITH DIFFERENTIAL/PLATELET
Abs Immature Granulocytes: 0.02 10*3/uL (ref 0.00–0.07)
Basophils Absolute: 0 10*3/uL (ref 0.0–0.1)
Basophils Relative: 0 %
Eosinophils Absolute: 0 10*3/uL (ref 0.0–0.5)
Eosinophils Relative: 0 %
HCT: 33.6 % — ABNORMAL LOW (ref 36.0–46.0)
Hemoglobin: 10.6 g/dL — ABNORMAL LOW (ref 12.0–15.0)
Immature Granulocytes: 0 %
Lymphocytes Relative: 28 %
Lymphs Abs: 1.3 10*3/uL (ref 0.7–4.0)
MCH: 26.9 pg (ref 26.0–34.0)
MCHC: 31.5 g/dL (ref 30.0–36.0)
MCV: 85.3 fL (ref 80.0–100.0)
Monocytes Absolute: 0.5 10*3/uL (ref 0.1–1.0)
Monocytes Relative: 10 %
Neutro Abs: 2.8 10*3/uL (ref 1.7–7.7)
Neutrophils Relative %: 62 %
Platelets: 475 10*3/uL — ABNORMAL HIGH (ref 150–400)
RBC: 3.94 MIL/uL (ref 3.87–5.11)
RDW: 16.2 % — ABNORMAL HIGH (ref 11.5–15.5)
WBC: 4.6 10*3/uL (ref 4.0–10.5)
nRBC: 0 % (ref 0.0–0.2)

## 2021-04-20 LAB — BASIC METABOLIC PANEL
Anion gap: 9 (ref 5–15)
BUN: 17 mg/dL (ref 6–20)
CO2: 23 mmol/L (ref 22–32)
Calcium: 9.2 mg/dL (ref 8.9–10.3)
Chloride: 104 mmol/L (ref 98–111)
Creatinine, Ser: 0.66 mg/dL (ref 0.44–1.00)
GFR, Estimated: >60 mL/min (ref >60)
Glucose, Bld: 91 mg/dL (ref 70–99)
Potassium: 4 mmol/L (ref 3.5–5.1)
Sodium: 136 mmol/L (ref 135–145)

## 2021-04-20 LAB — PROTIME-INR
INR: 0.9 (ref 0.8–1.2)
Prothrombin Time: 12.2 seconds (ref 11.4–15.2)

## 2021-04-20 LAB — HCG, SERUM, QUALITATIVE: Preg, Serum: NEGATIVE

## 2021-04-20 MED ORDER — FENTANYL CITRATE (PF) 100 MCG/2ML IJ SOLN
INTRAMUSCULAR | Status: AC | PRN
  Administered 2021-04-20: 50 ug via INTRAVENOUS
  Administered 2021-04-20 (×2): 25 ug via INTRAVENOUS
  Administered 2021-04-20 (×4): 50 ug via INTRAVENOUS

## 2021-04-20 MED ORDER — ENSURE ENLIVE PO LIQD: 237.0000 mL | Freq: Two times a day (BID) | ORAL | Status: DC

## 2021-04-20 MED ORDER — NITROGLYCERIN IN D5W 100-5 MCG/ML-% IV SOLN
INTRAVENOUS | Status: AC
  Filled 2021-04-20: qty 250

## 2021-04-20 MED ORDER — MIDAZOLAM HCL 2 MG/2ML IJ SOLN
INTRAMUSCULAR | Status: AC
  Filled 2021-04-20: qty 4

## 2021-04-20 MED ORDER — DEXAMETHASONE SODIUM PHOSPHATE 10 MG/ML IJ SOLN
INTRAMUSCULAR | Status: AC
  Administered 2021-04-20: 8 mg via INTRAVENOUS
  Filled 2021-04-20: qty 1

## 2021-04-20 MED ORDER — SODIUM CHLORIDE 0.9 % IV SOLN: INTRAVENOUS | Status: DC

## 2021-04-20 MED ORDER — LIDOCAINE HCL 1 % IJ SOLN
INTRAMUSCULAR | Status: AC
  Filled 2021-04-20: qty 20

## 2021-04-20 MED ORDER — ONDANSETRON HCL 4 MG/2ML IJ SOLN: 4.0000 mg | Freq: Four times a day (QID) | INTRAMUSCULAR | Status: DC | PRN

## 2021-04-20 MED ORDER — MIDAZOLAM HCL 2 MG/2ML IJ SOLN
INTRAMUSCULAR | Status: AC | PRN
  Administered 2021-04-20: 1 mg via INTRAVENOUS
  Administered 2021-04-20: 0.5 mg via INTRAVENOUS
  Administered 2021-04-20: 1 mg via INTRAVENOUS
  Administered 2021-04-20: 0.5 mg via INTRAVENOUS
  Administered 2021-04-20: 1 mg via INTRAVENOUS
  Administered 2021-04-20: 0.5 mg via INTRAVENOUS
  Administered 2021-04-20: 1 mg via INTRAVENOUS
  Administered 2021-04-20: 0.5 mg via INTRAVENOUS
  Administered 2021-04-20 (×2): 1 mg via INTRAVENOUS

## 2021-04-20 MED ORDER — SODIUM CHLORIDE 0.9% FLUSH
3.0000 mL | Freq: Two times a day (BID) | INTRAVENOUS | Status: DC
  Administered 2021-04-21: 3 mL via INTRAVENOUS

## 2021-04-20 MED ORDER — DOCUSATE SODIUM 100 MG PO CAPS
100.0000 mg | ORAL_CAPSULE | Freq: Two times a day (BID) | ORAL | Status: DC
  Administered 2021-04-20 – 2021-04-21 (×3): 100 mg via ORAL
  Filled 2021-04-20 (×3): qty 1

## 2021-04-20 MED ORDER — ONDANSETRON HCL 4 MG/2ML IJ SOLN
4.0000 mg | Freq: Four times a day (QID) | INTRAMUSCULAR | Status: DC | PRN
  Administered 2021-04-21: 4 mg via INTRAVENOUS
  Filled 2021-04-20: qty 2

## 2021-04-20 MED ORDER — SODIUM CHLORIDE 0.9% FLUSH: 9.0000 mL | INTRAVENOUS | Status: DC | PRN

## 2021-04-20 MED ORDER — FENTANYL CITRATE (PF) 100 MCG/2ML IJ SOLN
INTRAMUSCULAR | Status: AC
  Filled 2021-04-20: qty 4

## 2021-04-20 MED ORDER — NALOXONE HCL 0.4 MG/ML IJ SOLN: 0.4000 mg | INTRAMUSCULAR | Status: DC | PRN

## 2021-04-20 MED ORDER — ONDANSETRON HCL 4 MG/2ML IJ SOLN
INTRAMUSCULAR | Status: AC
  Filled 2021-04-20: qty 2

## 2021-04-20 MED ORDER — DIPHENHYDRAMINE HCL 50 MG/ML IJ SOLN: 12.5000 mg | Freq: Four times a day (QID) | INTRAMUSCULAR | Status: DC | PRN

## 2021-04-20 MED ORDER — DIPHENHYDRAMINE HCL 12.5 MG/5ML PO ELIX
12.5000 mg | ORAL_SOLUTION | Freq: Four times a day (QID) | ORAL | Status: DC | PRN
  Filled 2021-04-20: qty 5

## 2021-04-20 MED ORDER — IOHEXOL 300 MG/ML  SOLN
100.0000 mL | Freq: Once | INTRAMUSCULAR | Status: AC | PRN
  Administered 2021-04-20: 50 mL via INTRA_ARTERIAL

## 2021-04-20 MED ORDER — KETOROLAC TROMETHAMINE 30 MG/ML IJ SOLN
30.0000 mg | Freq: Four times a day (QID) | INTRAMUSCULAR | Status: DC
  Administered 2021-04-20 – 2021-04-21 (×3): 30 mg via INTRAVENOUS
  Filled 2021-04-20 (×3): qty 1

## 2021-04-20 MED ORDER — KETOROLAC TROMETHAMINE 30 MG/ML IJ SOLN
INTRAMUSCULAR | Status: AC | PRN
  Administered 2021-04-20: 30 mg via INTRAVENOUS

## 2021-04-20 MED ORDER — LIDOCAINE HCL (PF) 1 % IJ SOLN
INTRAMUSCULAR | Status: AC | PRN
Start: 2021-04-20 — End: 2021-04-20
  Administered 2021-04-20: 2 mL via INTRADERMAL
  Administered 2021-04-20: 10 mL via INTRADERMAL

## 2021-04-20 MED ORDER — DEXAMETHASONE SODIUM PHOSPHATE 10 MG/ML IJ SOLN: 8.0000 mg | Freq: Once | INTRAMUSCULAR | Status: AC

## 2021-04-20 MED ORDER — PROMETHAZINE HCL 25 MG PO TABS: 25.0000 mg | ORAL_TABLET | Freq: Three times a day (TID) | ORAL | Status: DC | PRN

## 2021-04-20 MED ORDER — KETOROLAC TROMETHAMINE 30 MG/ML IJ SOLN
INTRAMUSCULAR | Status: AC
  Filled 2021-04-20: qty 1

## 2021-04-20 MED ORDER — CEFAZOLIN SODIUM-DEXTROSE 2-4 GM/100ML-% IV SOLN: 2.0000 g | Freq: Once | INTRAVENOUS | Status: AC

## 2021-04-20 MED ORDER — MIDAZOLAM HCL 2 MG/2ML IJ SOLN
INTRAMUSCULAR | Status: AC
  Filled 2021-04-20: qty 6

## 2021-04-20 MED ORDER — SODIUM CHLORIDE 0.9% FLUSH: 3.0000 mL | INTRAVENOUS | Status: DC | PRN

## 2021-04-20 MED ORDER — SODIUM CHLORIDE 0.9 % IV SOLN: 250.0000 mL | INTRAVENOUS | Status: DC | PRN

## 2021-04-20 MED ORDER — PROMETHAZINE HCL 25 MG RE SUPP
25.0000 mg | Freq: Three times a day (TID) | RECTAL | Status: DC | PRN
  Filled 2021-04-20: qty 1

## 2021-04-20 MED ORDER — HYDROMORPHONE 1 MG/ML IV SOLN
INTRAVENOUS | Status: DC
Start: 2021-04-20 — End: 2021-04-21
  Administered 2021-04-20: 1.2 mg via INTRAVENOUS
  Administered 2021-04-20: 2.7 mg via INTRAVENOUS
  Administered 2021-04-21: 0.9 mg via INTRAVENOUS
  Administered 2021-04-21: 1.2 mg via INTRAVENOUS
  Filled 2021-04-20 (×11): qty 30

## 2021-04-20 MED ORDER — HEPARIN SODIUM (PORCINE) 1000 UNIT/ML IJ SOLN
INTRAMUSCULAR | Status: AC
  Filled 2021-04-20: qty 1

## 2021-04-20 MED ORDER — ONDANSETRON HCL 4 MG/2ML IJ SOLN
INTRAMUSCULAR | Status: AC | PRN
  Administered 2021-04-20: 4 mg via INTRAVENOUS

## 2021-04-20 MED ORDER — VERAPAMIL HCL 2.5 MG/ML IV SOLN
INTRAVENOUS | Status: AC
  Filled 2021-04-20: qty 2

## 2021-04-20 MED ORDER — CEFAZOLIN SODIUM-DEXTROSE 2-4 GM/100ML-% IV SOLN
INTRAVENOUS | Status: AC
  Administered 2021-04-20: 2 g via INTRAVENOUS
  Filled 2021-04-20: qty 100

## 2021-04-20 MED ORDER — KETOROLAC TROMETHAMINE 30 MG/ML IJ SOLN: 30.0000 mg | Freq: Once | INTRAMUSCULAR | Status: AC

## 2021-04-20 NOTE — Sedation Documentation (Signed)
Radial access procedure aborted per Dr Anselm Pancoast.. Patient prepped for groin access.

## 2021-04-20 NOTE — Procedures (Signed)
Interventional Radiology Procedure:   Indications: Fibroids and menorrhagia  Procedure: Bilateral uterine artery embolization  Findings: Left radial artery access was abandoned due to spasm after needle placement.  Successful embolization of bilateral uterine arteries from right femoral approach.  Exoseal device used for right groin closure.  Complications: None     EBL: less than 20 ml  Plan: Overnight observation for pain control.    Quan Cybulski R. Anselm Pancoast, MD  Pager: [phone]

## 2021-04-20 NOTE — Progress Notes (Signed)
IR.  Patient underwent an image-guided pelvic arteriogram with bilateral uterine artery embolization via right femoral approach this AM by Dr. Anselm Pancoast.  Patient evaluated bedside following procedure. Patient awake and alert laying in bed. Asking to sit up. Rating pelvic pain 10/10 (encouraged to use PCA). Female family member at bedside.  Right femoral puncture site soft without active bleeding or hematoma. Left radial puncture site (attempted first access) soft without active bleeding or hematoma. Distal pulses (DPs, radials) palpable bilaterally.  Plan to stay in house for overnight observation/pain control. Encourage to use PCA. D/C foley by tomorrow AM. Will transition to PO pain medications in AM for hopes of possible D/C tomorrow. IR to follow.   Alexis Graff Pia Jedlicka, PA-C 04/20/2021, 5:14 PM

## 2021-04-20 NOTE — H&P (Signed)
Chief Complaint: Patient was seen in consultation today for pelvic arteriogram with possible bilateral uterine artery and embolization at the request of Henn,Adam  Referring Physician(s): Henn,Adam  Supervising Physician: Markus Daft  Patient Status: [facility] - Out-pt  History of Present Illness: Alexis Santana is a 45 y.o. female with PMH of eczema, angio-edema, urticaria, and uterine fibroid with heavy menstrual bleeding. Patient is familiar to our service from consultation with Dr. Anselm Pancoast on 02/23/21 to discuss treatment options for symptomatic uterine fibroids. She was deemed an appropriate candidate for bilateral uterine artery embolization and presents today for the procedure.   Patient laying in bed, not in acute distress.  Denise headache, fever, chills, shortness of breath, cough, chest pain, abdominal pain, nausea ,vomiting, and bleeding.   Past Medical History:  Diagnosis Date  . Angio-edema   . Eczema   . Urticaria     Past Surgical History:  Procedure Laterality Date  . HAMMER TOE SURGERY    . HERNIA REPAIR    . IR RADIOLOGIST EVAL & MGMT  02/23/2021  . MYOMECTOMY      Allergies: Patient has no known allergies.  Medications: Prior to Admission medications   Medication Sig Start Date End Date Taking? Authorizing Provider  meloxicam (MOBIC) 7.5 MG tablet Take 1 tablet (7.5 mg total) by mouth daily. 07/28/19   Maximiano Coss, NP     Family History  Problem Relation Age of Onset  . Hypertension Mother   . Cancer Mother        breast   . Diabetes Father     Social History   Socioeconomic History  . Marital status: Single    Spouse name: Not on file  . Number of children: Not on file  . Years of education: Not on file  . Highest education level: Not on file  Occupational History  . Not on file  Tobacco Use  . Smoking status: Current Every Day Smoker    Packs/day: 0.25    Years: 9.00    Pack years: 2.25    Types: Cigarettes  . Smokeless tobacco: Never  Used  Vaping Use  . Vaping Use: Never used  Substance and Sexual Activity  . Alcohol use: Yes    Alcohol/week: 2.0 standard drinks    Types: 2 Shots of liquor per week  . Drug use: Never  . Sexual activity: Not on file  Other Topics Concern  . Not on file  Social History Narrative  . Not on file   Social Determinants of Health   Financial Resource Strain: Not on file  Food Insecurity: Not on file  Transportation Needs: Not on file  Physical Activity: Not on file  Stress: Not on file  Social Connections: Not on file     Review of Systems: A 12 point ROS discussed and pertinent positives are indicated in the HPI above.  All other systems are negative.   Vital Signs: BP 117/67   Pulse 74   Temp 99 F (37.2 C) (Oral)   Resp 16   Ht 5\' 5"  (1.651 m)   Wt 150 lb (68 kg)   LMP 04/13/2021 (Exact Date)   SpO2 99%   BMI 24.96 kg/m   Physical Exam  Vitals and nursing note reviewed.  Constitutional:      General: He is not in acute distress.    Appearance: Normal appearance.  HENT:     Head: Normocephalic and atraumatic.     Mouth/Throat:     Mouth: Mucous  membranes are moist.     Pharynx: Oropharynx is clear.  Cardiovascular:     Rate and Rhythm: Normal rate and regular rhythm.     Pulses: Normal pulses.     Heart sounds: Normal heart sounds.  Pulmonary:     Effort: Pulmonary effort is normal.     Breath sounds: Normal breath sounds. No wheezing, rhonchi or rales.  Abdominal:     General: Bowel sounds are normal. There is no distension.     Palpations: Abdomen is soft.  Skin:    General: Skin is warm and dry.  Neurological:     Mental Status: He is alert and oriented to person, place, and time.  Psychiatric:        Mood and Affect: Mood normal.        Behavior: Behavior normal.   MD Evaluation Airway: WNL Heart: WNL Abdomen: WNL Chest/ Lungs: WNL ASA  Classification: 2 Mallampati/Airway Score: Two  Imaging: No results found.  Labs:  CBC: No  results for input(s): WBC, HGB, HCT, PLT in the last 8760 hours.  COAGS: Recent Labs    04/20/21 1036  INR 0.9    BMP: Recent Labs    04/20/21 1036  NA 136  K 4.0  CL 104  CO2 23  GLUCOSE 91  BUN 17  CALCIUM 9.2  CREATININE 0.66  GFRNONAA >60    LIVER FUNCTION TESTS: No results for input(s): BILITOT, AST, ALT, ALKPHOS, PROT, ALBUMIN in the last 8760 hours.  TUMOR MARKERS: No results for input(s): AFPTM, CEA, CA199, CHROMGRNA in the last 8760 hours.  Assessment and Plan: 45 y.o. female with symptomatic uterin fibroids.   Patient had a consultation with Dr. Anselm Pancoast on 02/23/21 to discuss treatment options for symptomatic uterine fibroids. She was deemed an appropriate candidate for bilateral uterine artery embolization and presents today for the procedure.  Npo since midnight   hCG negative Creatinine 0.66, BUN 17, GFR >60 INR 0.9  CBC with diff pending   Risks and benefits of procedure were discussed with the patient including, but not limited to bleeding, infection, vascular injury or contrast induced renal failure.  This interventional procedure involves the use of X-rays and because of the nature of the planned procedure, it is possible that we will have prolonged use of X-ray fluoroscopy.  Potential radiation risks to you include (but are not limited to) the following: - A slightly elevated risk for cancer several years later in life. This risk is typically less than 0.5% percent. This risk is low in comparison to the normal incidence of human cancer, which is 33% for women and 50% for men according to the [employer].  - Radiation induced injury can include skin redness, resembling a rash, tissue breakdown / ulcers and hair loss (which can be temporary or permanent).   The likelihood of either of these occurring depends on the difficulty of the procedure and whether you are sensitive to radiation due to previous procedures, disease, or genetic  conditions.   IF your procedure requires a prolonged use of radiation, you will be notified and given written instructions for further action.  It is your responsibility to monitor the irradiated area for the 2 weeks following the procedure and to notify your physician if you are concerned that you have suffered a radiation induced injury.    All of the patient's questions were answered, patient is agreeable to proceed.  Consent signed and in chart.    Thank you for this interesting  consult.  I greatly enjoyed meeting Alexis Santana and look forward to participating in their care.  A copy of this report was sent to the requesting provider on this date.  Electronically Signed: Tera Mater, PA-C 04/20/2021, 11:32 AM   I spent a total of   25 Minutes in face to face in clinical consultation, greater than 50% of which was counseling/coordinating care for pelvic arteriogram with possible bilateral uterine artery and embolization

## 2021-04-21 ENCOUNTER — Other Ambulatory Visit (HOSPITAL_COMMUNITY): Payer: Self-pay

## 2021-04-21 DIAGNOSIS — F17210 Nicotine dependence, cigarettes, uncomplicated: Secondary | ICD-10-CM | POA: Diagnosis not present

## 2021-04-21 DIAGNOSIS — N920 Excessive and frequent menstruation with regular cycle: Secondary | ICD-10-CM | POA: Diagnosis not present

## 2021-04-21 DIAGNOSIS — D259 Leiomyoma of uterus, unspecified: Secondary | ICD-10-CM | POA: Diagnosis not present

## 2021-04-21 DIAGNOSIS — Z791 Long term (current) use of non-steroidal anti-inflammatories (NSAID): Secondary | ICD-10-CM | POA: Diagnosis not present

## 2021-04-21 DIAGNOSIS — Z803 Family history of malignant neoplasm of breast: Secondary | ICD-10-CM | POA: Diagnosis not present

## 2021-04-21 MED ORDER — DOCUSATE SODIUM 100 MG PO CAPS
100.0000 mg | ORAL_CAPSULE | Freq: Two times a day (BID) | ORAL | 0 refills | Status: AC
  Filled 2021-04-21: qty 10, 5d supply, fill #0

## 2021-04-21 MED ORDER — OXYCODONE-ACETAMINOPHEN 5-325 MG PO TABS
1.0000 | ORAL_TABLET | Freq: Four times a day (QID) | ORAL | 0 refills | Status: AC | PRN
  Filled 2021-04-21: qty 30, 4d supply, fill #0

## 2021-04-21 MED ORDER — OXYCODONE-ACETAMINOPHEN 5-325 MG PO TABS
1.0000 | ORAL_TABLET | Freq: Four times a day (QID) | ORAL | Status: DC | PRN
  Administered 2021-04-21: 2 via ORAL
  Filled 2021-04-21: qty 2

## 2021-04-21 MED ORDER — PROMETHAZINE HCL 25 MG PO TABS
25.0000 mg | ORAL_TABLET | Freq: Three times a day (TID) | ORAL | 0 refills | Status: AC | PRN
  Filled 2021-04-21: qty 30, 10d supply, fill #0

## 2021-04-21 NOTE — Plan of Care (Signed)
Instructions were reviewed with patient. All questions were answered. Patient was transported to main entrance by wheelchair. ° °

## 2021-04-21 NOTE — Discharge Summary (Signed)
Patient ID: LAN ENTSMINGER MRN: [id number] DOB/AGE: [birth year] 45 y.o.  Admit date: 04/20/2021 Discharge date: 04/21/2021  Supervising Physician: Daryll Brod  Patient Status: [facility] - In-pt  Admission Diagnoses: menorrhagia, symptomatic uterine fibroids  Discharge Diagnoses:  Active Problems:   Menorrhagia   Discharged Condition: good  Hospital Course:   Patient presented to [facility] IR for a pelvic arteriogram with possible bilateral uterine artery embolization with Dr. Anselm Pancoast on 04/20/21. Procedure occurred without major complications and patient was transferred to floor in stable condition (VSS, puncture site stable) for overnight observation. Patient vomited overnight and this morning, received Zofran. No other major events occurred overnight.   Patient awake and alert, states 3-4/10 cramping. Patient transtioned from IV narcotic to PO this morning.  Foley catheter removed last night  without difficulty.  Right groin and left radial puncture sites stable. Plan to discharge home today and follow-up with Dr. Anselm Pancoast for televisit 3-4 weeks after discharge.  Electronics prescriptions were send in for percocet, colace, and phenergan.   Consults: None  Significant Diagnostic Studies: none   Treatments: embolization of bilateral uterine artery   Discharge Exam: Blood pressure 129/79, pulse (!) 53, temperature 97.8 F (36.6 C), temperature source Oral, resp. rate 18, height 5\' 5"  (1.651 m), weight 150 lb (68 kg), last menstrual period 04/13/2021, SpO2 100 %. General appearance: alert and no distress Head: Normocephalic, without obvious abnormality, atraumatic Resp: clear to auscultation bilaterally Cardio: regular rate and rhythm, S1, S2 normal, no murmur, click, rub or gallop GI: soft, non-tender; bowel sounds normal; no masses,  no organomegaly and Positive for dressin on right groin puncture site. Puncture site clean, dry, soft to palpation, no sign of infection or acute  bleeind. Pulses: 2+ and symmetric Skin: Positive for left radial puncture site. Puncture site clean, dry, no sing of acut beeding or infection.   Disposition: Discharge disposition: 01-Home or Self Care       Discharge Instructions    Call MD for:  difficulty breathing, headache or visual disturbances   Complete by: As directed    Call MD for:  extreme fatigue   Complete by: As directed    Call MD for:  hives   Complete by: As directed    Call MD for:  persistant dizziness or light-headedness   Complete by: As directed    Call MD for:  persistant nausea and vomiting   Complete by: As directed    Call MD for:  redness, tenderness, or signs of infection (pain, swelling, redness, odor or green/yellow discharge around incision site)   Complete by: As directed    Call MD for:  severe uncontrolled pain   Complete by: As directed    Call MD for:  temperature >100.4   Complete by: As directed    Change dressing (specify)   Complete by: As directed    May apply bandaid to puncture site right groin for next 2-3 days; may wash site with soap and water   Diet - low sodium heart healthy   Complete by: As directed    Discharge instructions   Complete by: As directed    Stay well hydrated; take 600 mg ibuprofen every 6  hours for 5 days then taper to usual home dosing as needed   Driving Restrictions   Complete by: As directed    No driving for next 24 hours or after taking narcotics   Increase activity slowly   Complete by: As directed    Lifting restrictions  Complete by: As directed    No heavy lifting for the next 3-4 days   Sexual Activity Restrictions   Complete by: As directed    No sexual intercourse for 1 week     Allergies as of 04/21/2021   No Known Allergies     Medication List    STOP taking these medications   meloxicam 7.5 MG tablet Commonly known as: MOBIC     TAKE these medications   docusate sodium 100 MG capsule Commonly known as: COLACE Take 1  capsule (100 mg total) by mouth 2 (two) times daily.   ibuprofen 200 MG tablet Commonly known as: ADVIL Take 400 mg by mouth every 6 (six) hours as needed for mild pain.   levocetirizine 5 MG tablet Commonly known as: XYZAL Take 5 mg by mouth daily as needed for allergies.   oxyCODONE-acetaminophen 5-325 MG tablet Commonly known as: PERCOCET/ROXICET Take 1-2 tablets by mouth every 6 (six) hours as needed (for moderate pain).   promethazine 25 MG tablet Commonly known as: PHENERGAN Take 1 tablet (25 mg total) by mouth every 8 (eight) hours as needed for nausea.            Discharge Care Instructions  (From admission, onward)         Start     Ordered   04/21/21 0000  Change dressing (specify)       Comments: May apply bandaid to puncture site right groin for next 2-3 days; may wash site with soap and water   04/21/21 0955          Follow-up Information    Markus Daft, MD Follow up.   Specialties: Interventional Radiology, Radiology Why: radiology will call you with follow up appt with Dr. Anselm Pancoast in 3-4 weeks; call [phone] or [phone] with any questions Contact information: [address] [place] [address] [phone]        Servando Salina, MD Follow up.   Specialty: Obstetrics and Gynecology Why: follow up with Dr. Garwin Brothers as scheduled Contact information: [address] [place] [address] [phone]                Electronically Signed: D. Rowe Robert, PA-C 04/21/2021, 10:00 AM   I have spent Less Than 30 Minutes discharging [place].

## 2021-04-21 NOTE — Discharge Instructions (Signed)
Uterine Fibroids  Uterine fibroids are lumps of tissue (tumors) in the womb (uterus). Fibroids are not cancerous. Most women with this condition do not need treatment. Sometimes, fibroids can make it harder to have children. If this happens, you may need surgery to take out the fibroids. What are the causes? The cause of this condition is not known. What increases the risk?  You are in your 30s or 40s and have not gone through menopause. Menopause is when you have not had a menstrual period for 12 months.  Having a history of fibroids in your family.  You are of African American descent.  You started your period at age 4 or younger.  You have not given birth.  You are overweight or very overweight. What are the signs or symptoms?  Bleeding between menstrual periods.  Heavy bleeding during your menstrual period.  Pain in the area between your hips.  Needing to pee (urinate) right away or more often than usual.  Not being able to have children (infertility).  Not being able to stay pregnant (miscarriage). Many women do not have symptoms.  How is this treated? Treatment may include:  Follow-up visits with your doctor to check your fibroids for any changes.  Medicines to help with pain, such as aspirin or ibuprofen.  Hormone therapy. This may be given as a pill, in a shot, or with a type of birth control device called an IUD.  Surgery that would do one of these things: ? Take out the fibroids. This may be done if you want to become pregnant. ? Take out the womb (hysterectomy). ? Stop the blood flow to the fibroids. Follow these instructions at home: Medicines  Take over-the-counter and prescription medicines only as told by your doctor.  Ask your doctor if you should: ? Take iron pills. ? Eat more foods that have a lot of iron in them, such as dark green, leafy vegetables. Managing pain If told, put heat on your back or belly. Do this as often as told by your  doctor. Use the heat source that your doctor recommends, such as a moist heat pack or a heating pad. To do this:  Put a towel between your skin and the heat pack or pad.  Leave the heat on for 20-30 minutes.  Take off the heat if your skin turns bright red. This is very important. If you cannot feel pain, heat, or cold, you may have a greater risk of getting burned.   General instructions  Tell your doctor about any changes to your menstrual period, such as: ? Heavy bleeding that needs a change of tampons or pads more than normal. ? A change in how many days your period lasts. ? A change in symptoms that come with your period. This might be belly cramps or back pain.  Keep all follow-up visits. Contact a doctor if:  You have pain that does not get better with medicine or heat. This may include pain or cramps in: ? The area between your hip bones. ? Your back. ? Your belly.  You have new bleeding between your periods.  You have more bleeding during or between your periods.  You feel very tired or weak.  You feel dizzy. Get help right away if:  You faint.  You have pain in the area between your hip bones that gets worse.  You have bleeding that soaks a tampon or pad in 30 minutes or less. Summary  Uterine fibroids are lumps of  tissue (tumors) in your womb. They are not cancerous.  Medicines such as aspirin or ibuprofen may be used to help with pain.  Contact a doctor if you have pain or cramps that do not get better with medicine.  Know the symptoms for when you should get help right away. This information is not intended to replace advice given to you by your health care provider. Make sure you discuss any questions you have with your health care provider. Document Revised: 07/12/2020 Document Reviewed: 07/12/2020 Elsevier Patient Education  2021 Elsevier Inc.  Uterine Artery Embolization for Fibroids, Care After This sheet gives you information about how to care for  yourself after your procedure. Your health care provider may also give you more specific instructions. If you have problems or questions, contact your health care provider. What can I expect after the procedure? After your procedure, it is common to have: Pelvic cramping. You will be given pain medicine. Nausea and vomiting. You may be given medicine to help relieve nausea. Follow these instructions at home: Incision care Follow instructions from your health care provider about how to take care of your incision. Make sure you: Wash your hands with soap and water before you change your bandage (dressing). If soap and water are not available, use hand sanitizer. Change your dressing as told by your health care provider. Check your incision area every day for signs of infection. Check for: More redness, swelling, or pain. More fluid or blood. Warmth. Pus or a bad smell. Medicines Take over-the-counter and prescription medicines only as told by your health care provider. Do not take aspirin. It can cause bleeding. Do not drive for 24 hours if you were given a medicine to help you relax (sedative). Do not drive or use heavy machinery while taking prescription pain medicine.   General instructions Ask your health care provider when you can resume sexual activity. To prevent or treat constipation while you are taking prescription pain medicine, your health care provider may recommend that you: Drink enough fluid to keep your urine clear or pale yellow. Take over-the-counter or prescription medicines. Eat foods that are high in fiber, such as fresh fruits and vegetables, whole grains, and beans. Limit foods that are high in fat and processed sugars, such as fried and sweet foods. Contact a health care provider if: You have a fever. You have more redness, swelling, or pain around your incision site. You have more fluid or blood coming from your incision site. Your incision feels warm to the  touch. You have pus or a bad smell coming from your incision. You have a rash. You have uncontrolled nausea or you cannot eat or drink anything without vomiting. Get help right away if: You have trouble breathing. You have chest pain. You have severe abdominal pain. You have leg pain. You become dizzy and faint. Summary After your procedure, it is common to have pelvic cramping. You will be given pain medicine. Follow instructions from your health care provider about how to take care of your incision. Check your incision area every day for signs of infection. Take over-the-counter and prescription medicines only as told by your health care provider. This information is not intended to replace advice given to you by your health care provider. Make sure you discuss any questions you have with your health care provider. Document Revised: 11/22/2017 Document Reviewed: 03/14/2017 Elsevier Patient Education  2021 Elsevier Inc.    any questions you have with your health care provider. Document Revised: 11/22/2017 Document Reviewed: 03/14/2017 Elsevier Patient Education  2021 [employer].

## 2021-06-08 ENCOUNTER — Ambulatory Visit
Admission: RE | Admit: 2021-06-08 | Discharge: 2021-06-08 | Disposition: A | Discharge status: Home / Self Care | Payer: Federal, State, Local not specified - PPO | Source: Ambulatory Visit | Attending: Radiology | Admitting: Radiology

## 2021-06-08 ENCOUNTER — Other Ambulatory Visit: Payer: Self-pay

## 2021-06-08 ENCOUNTER — Encounter: Payer: Self-pay | Admitting: *Deleted

## 2021-06-08 DIAGNOSIS — D259 Leiomyoma of uterus, unspecified: Secondary | ICD-10-CM | POA: Diagnosis not present

## 2021-06-08 DIAGNOSIS — Z9889 Other specified postprocedural states: Secondary | ICD-10-CM | POA: Diagnosis not present

## 2021-06-08 HISTORY — PX: IR RADIOLOGIST EVAL & MGMT: IMG5224

## 2021-06-08 NOTE — Progress Notes (Signed)
Chief Complaint: Patient was consulted remotely today (TeleHealth) for follow-up after uterine artery embolization.  Referring Physician(s): Alexis Santana  History of Present Illness: Alexis Santana is a 45 y.o. female with a uterine fibroids and menorrhagia.  Patient underwent bilateral uterine artery embolization procedure on 04/20/2021.  The procedure was technically successful.  Patient has been doing very well since her procedure.  She reports two menstrual periods since the procedure.  The periods have lasted 4 to 5 days and have been very light.  She says the bleeding is 60 to 70% lighter than usual.  She continues to have some cramping but it is less than it was prior to the procedure.  Overall, the patient is very happy with the early results from the procedure.  She reports no issues with the groin access site.    Past Medical History:  Diagnosis Date   Angio-edema    Eczema    Urticaria     Past Surgical History:  Procedure Laterality Date   HAMMER TOE SURGERY     HERNIA REPAIR     IR ANGIOGRAM PELVIS SELECTIVE OR SUPRASELECTIVE  04/20/2021   IR ANGIOGRAM SELECTIVE EACH ADDITIONAL VESSEL  04/20/2021   IR ANGIOGRAM SELECTIVE EACH ADDITIONAL VESSEL  04/20/2021   IR EMBO TUMOR ORGAN ISCHEMIA INFARCT INC GUIDE ROADMAPPING  04/20/2021   IR RADIOLOGIST EVAL & MGMT  02/23/2021   IR US GUIDE VASC ACCESS LEFT  04/20/2021   IR US GUIDE VASC ACCESS RIGHT  04/20/2021   MYOMECTOMY      Allergies: Patient has no known allergies.  Medications: Prior to Admission medications   Medication Sig Start Date End Date Taking? Authorizing Provider  docusate sodium (COLACE) 100 MG capsule Take 1 capsule (100 mg total) by mouth 2 (two) times daily. 04/21/21   Allred, Darrell K, PA-C  ibuprofen (ADVIL) 200 MG tablet Take 400 mg by mouth every 6 (six) hours as needed for mild pain.    [provider]  levocetirizine (XYZAL) 5 MG tablet Take 5 mg by mouth daily as needed for  allergies.    [provider]  oxyCODONE-acetaminophen (PERCOCET/ROXICET) 5-325 MG tablet Take 1-2 tablets by mouth every 6 (six) hours as needed (for moderate pain). 04/21/21   Allred, Shirlyn Goltz, PA-C  promethazine (PHENERGAN) 25 MG tablet Take 1 tablet (25 mg total) by mouth every 8 (eight) hours as needed for nausea. 04/21/21   Allred, Shirlyn Goltz, PA-C     Family History  Problem Relation Age of Onset   Hypertension Mother    Cancer Mother        breast    Diabetes Father     Social History   Socioeconomic History   Marital status: Single    Spouse name: Not on file   Number of children: Not on file   Years of education: Not on file   Highest education level: Not on file  Occupational History   Not on file  Tobacco Use   Smoking status: Every Day    Packs/day: 0.25    Years: 9.00    Pack years: 2.25    Types: Cigarettes   Smokeless tobacco: Never  Vaping Use   Vaping Use: Never used  Substance and Sexual Activity   Alcohol use: Yes    Alcohol/week: 2.0 standard drinks    Types: 2 Shots of liquor per week   Drug use: Never   Sexual activity: Not on file  Other Topics Concern   Not  on file  Social History Narrative   Not on file   Social Determinants of Health   Financial Resource Strain: Not on file  Food Insecurity: Not on file  Transportation Needs: Not on file  Physical Activity: Not on file  Stress: Not on file  Social Connections: Not on file    Review of Systems  Constitutional: Negative.   Genitourinary:  Positive for menstrual problem and pelvic pain.    Physical Exam No direct physical exam was performed   Vital Signs: There were no vitals taken for this visit.  Imaging: No results found.  Labs:  CBC: Recent Labs    04/20/21 1136  WBC 4.6  HGB 10.6*  HCT 33.6*  PLT 475*    COAGS: Recent Labs    04/20/21 1036  INR 0.9    BMP: Recent Labs    04/20/21 1036  NA 136  K 4.0  CL 104  CO2 23  GLUCOSE 91  BUN 17   CALCIUM 9.2  CREATININE 0.66  GFRNONAA >60    LIVER FUNCTION TESTS: No results for input(s): BILITOT, AST, ALT, ALKPHOS, PROT, ALBUMIN in the last 8760 hours.  TUMOR MARKERS: No results for input(s): AFPTM, CEA, CA199, CHROMGRNA in the last 8760 hours.  Assessment and Plan:  45 year old with uterine fibroids and menorrhagia.  Patient underwent successful bilateral uterine artery embolization procedure on 04/20/2021.  Since the procedure, the patient has had 2 menstrual periods.  Both of menstrual periods have been very light with minimal cramping.  She is very happy with the results of the procedure at this point.  Patient has no concerns at this time.  Plan for follow-up visit and pelvic MRI in 6 months to reassess the uterine fibroids.  Patient will contact us if she has any questions or concerns in the interim.   Electronically Signed: Burman Riis 06/08/2021, 12:25 PM   I spent a total of    5 Minutes in remote  clinical consultation, greater than 50% of which was counseling/coordinating care for uterine fibroids.    Visit type: Audio only (telephone). Audio (no video) only due to patient preference. Alternative for in-person consultation at [facility], [address], [place], [place]. This visit type was conducted due to national recommendations for restrictions regarding the COVID-19 Pandemic (e.g. social distancing).  This format is felt to be most appropriate for this patient at this time.  All issues noted in this document were discussed and addressed.   Patient ID: Alexis Santana, female   DOB: February 26, 1976, 45 y.o.   MRN: [id number]

## 2021-09-11 ENCOUNTER — Other Ambulatory Visit: Payer: Self-pay | Admitting: Diagnostic Radiology

## 2021-09-11 DIAGNOSIS — D250 Submucous leiomyoma of uterus: Secondary | ICD-10-CM

## 2021-10-05 DIAGNOSIS — Z1231 Encounter for screening mammogram for malignant neoplasm of breast: Secondary | ICD-10-CM | POA: Diagnosis not present

## 2021-10-09 DIAGNOSIS — Z01419 Encounter for gynecological examination (general) (routine) without abnormal findings: Secondary | ICD-10-CM | POA: Diagnosis not present

## 2021-10-09 DIAGNOSIS — Z124 Encounter for screening for malignant neoplasm of cervix: Secondary | ICD-10-CM | POA: Diagnosis not present

## 2021-10-09 DIAGNOSIS — E049 Nontoxic goiter, unspecified: Secondary | ICD-10-CM | POA: Diagnosis not present

## 2021-10-12 ENCOUNTER — Other Ambulatory Visit: Payer: Self-pay | Admitting: Obstetrics and Gynecology

## 2021-10-12 DIAGNOSIS — Z131 Encounter for screening for diabetes mellitus: Secondary | ICD-10-CM | POA: Diagnosis not present

## 2021-10-12 DIAGNOSIS — Z0000 Encounter for general adult medical examination without abnormal findings: Secondary | ICD-10-CM | POA: Diagnosis not present

## 2021-10-12 DIAGNOSIS — Z13220 Encounter for screening for lipoid disorders: Secondary | ICD-10-CM | POA: Diagnosis not present

## 2021-10-12 DIAGNOSIS — E049 Nontoxic goiter, unspecified: Secondary | ICD-10-CM

## 2021-10-23 ENCOUNTER — Ambulatory Visit
Admission: RE | Admit: 2021-10-23 | Discharge: 2021-10-23 | Disposition: A | Discharge status: Home / Self Care | Payer: Federal, State, Local not specified - PPO | Source: Ambulatory Visit | Attending: Obstetrics and Gynecology | Admitting: Obstetrics and Gynecology

## 2021-10-23 DIAGNOSIS — E041 Nontoxic single thyroid nodule: Secondary | ICD-10-CM | POA: Diagnosis not present

## 2021-10-23 DIAGNOSIS — E049 Nontoxic goiter, unspecified: Secondary | ICD-10-CM

## 2021-11-09 ENCOUNTER — Other Ambulatory Visit: Payer: Self-pay | Admitting: Obstetrics and Gynecology

## 2021-11-09 DIAGNOSIS — Z1211 Encounter for screening for malignant neoplasm of colon: Secondary | ICD-10-CM | POA: Diagnosis not present

## 2021-11-09 DIAGNOSIS — E041 Nontoxic single thyroid nodule: Secondary | ICD-10-CM

## 2021-11-14 ENCOUNTER — Ambulatory Visit
Admission: RE | Admit: 2021-11-14 | Discharge: 2021-11-14 | Disposition: A | Discharge status: Home / Self Care | Payer: Federal, State, Local not specified - PPO | Source: Ambulatory Visit | Attending: Obstetrics and Gynecology | Admitting: Obstetrics and Gynecology

## 2021-11-14 ENCOUNTER — Other Ambulatory Visit (HOSPITAL_COMMUNITY)
Admission: RE | Admit: 2021-11-14 | Discharge: 2021-11-14 | Disposition: A | Discharge status: Home / Self Care | Payer: Federal, State, Local not specified - PPO | Source: Ambulatory Visit | Attending: Obstetrics and Gynecology | Admitting: Obstetrics and Gynecology

## 2021-11-14 DIAGNOSIS — E041 Nontoxic single thyroid nodule: Secondary | ICD-10-CM

## 2021-11-15 LAB — CYTOLOGY - NON PAP

## 2021-12-04 ENCOUNTER — Encounter (HOSPITAL_COMMUNITY): Payer: Self-pay

## 2022-02-09 DIAGNOSIS — K5730 Diverticulosis of large intestine without perforation or abscess without bleeding: Secondary | ICD-10-CM | POA: Diagnosis not present

## 2022-02-09 DIAGNOSIS — D125 Benign neoplasm of sigmoid colon: Secondary | ICD-10-CM | POA: Diagnosis not present

## 2022-02-09 DIAGNOSIS — K635 Polyp of colon: Secondary | ICD-10-CM | POA: Diagnosis not present

## 2022-02-09 DIAGNOSIS — Z1211 Encounter for screening for malignant neoplasm of colon: Secondary | ICD-10-CM | POA: Diagnosis not present

## 2022-02-16 ENCOUNTER — Other Ambulatory Visit: Payer: Self-pay

## 2022-02-16 ENCOUNTER — Ambulatory Visit (HOSPITAL_COMMUNITY)
Admission: RE | Admit: 2022-02-16 | Discharge: 2022-02-16 | Disposition: A | Discharge status: Home / Self Care | Payer: Federal, State, Local not specified - PPO | Source: Ambulatory Visit | Attending: Diagnostic Radiology | Admitting: Diagnostic Radiology

## 2022-02-16 DIAGNOSIS — D250 Submucous leiomyoma of uterus: Secondary | ICD-10-CM | POA: Diagnosis not present

## 2022-02-16 DIAGNOSIS — D259 Leiomyoma of uterus, unspecified: Secondary | ICD-10-CM | POA: Diagnosis not present

## 2022-02-16 MED ORDER — GADOBUTROL 1 MMOL/ML IV SOLN
7.0000 mL | Freq: Once | INTRAVENOUS | Status: AC | PRN
  Administered 2022-02-16: 7 mL via INTRAVENOUS

## 2022-02-23 ENCOUNTER — Ambulatory Visit
Admission: RE | Admit: 2022-02-23 | Discharge: 2022-02-23 | Disposition: A | Discharge status: Home / Self Care | Payer: Federal, State, Local not specified - PPO | Source: Ambulatory Visit | Attending: Diagnostic Radiology | Admitting: Diagnostic Radiology

## 2022-02-23 ENCOUNTER — Other Ambulatory Visit: Payer: Self-pay

## 2022-02-23 ENCOUNTER — Encounter: Payer: Self-pay | Admitting: *Deleted

## 2022-02-23 DIAGNOSIS — D250 Submucous leiomyoma of uterus: Secondary | ICD-10-CM

## 2022-02-23 DIAGNOSIS — D259 Leiomyoma of uterus, unspecified: Secondary | ICD-10-CM | POA: Diagnosis not present

## 2022-02-23 DIAGNOSIS — Z9889 Other specified postprocedural states: Secondary | ICD-10-CM | POA: Diagnosis not present

## 2022-02-23 HISTORY — PX: IR RADIOLOGIST EVAL & MGMT: IMG5224

## 2022-02-23 NOTE — Progress Notes (Signed)
? ? ?Chief Complaint: ?Patient was consulted remotely today (TeleHealth) for uterine artery embolization follow-up. ? ?Referring Physician(s): ?Servando Salina ? ?History of Present Illness: ?Alexis Santana is a 46 y.o. female with history of symptomatic uterine fibroids and history of myomectomy procedure.  Patient underwent uterine artery embolization procedure on 04/20/2021.  Patient had a follow-up MRI on 02/16/2022.  Overall, the patient has been very happy with the results of the uterine artery embolization.  The menstrual cramping has markedly decreased since the procedure.  She has maybe 1 day of mild cramping.  The menstrual bleeding is regular and usually lasts total of 5 days.  There are no heavy days of bleeding and she reports 1 to 2 days of light bleeding.  Prior to the procedure, she was having 2 to 7 days of very heavy bleeding.  She does not complain of any urinary or bowel symptoms.  She has no new health problems. ? ?Past Medical History:  ?Diagnosis Date  ? Angio-edema   ? Eczema   ? Urticaria   ? ? ?Past Surgical History:  ?Procedure Laterality Date  ? HAMMER TOE SURGERY    ? HERNIA REPAIR    ? IR ANGIOGRAM PELVIS SELECTIVE OR SUPRASELECTIVE  04/20/2021  ? IR ANGIOGRAM SELECTIVE EACH ADDITIONAL VESSEL  04/20/2021  ? IR ANGIOGRAM SELECTIVE EACH ADDITIONAL VESSEL  04/20/2021  ? IR EMBO TUMOR ORGAN ISCHEMIA INFARCT INC GUIDE ROADMAPPING  04/20/2021  ? IR RADIOLOGIST EVAL & MGMT  02/23/2021  ? IR RADIOLOGIST EVAL & MGMT  06/08/2021  ? IR US GUIDE VASC ACCESS LEFT  04/20/2021  ? IR US GUIDE VASC ACCESS RIGHT  04/20/2021  ? MYOMECTOMY    ? ? ?Allergies: ?Patient has no known allergies. ? ?Medications: ?Prior to Admission medications   ?Medication Sig Start Date End Date Taking? Authorizing Provider  ?docusate sodium (COLACE) 100 MG capsule Take 1 capsule (100 mg total) by mouth 2 (two) times daily. 04/21/21   Allred, Darrell K, PA-C  ?ibuprofen (ADVIL) 200 MG tablet Take 400 mg by mouth every 6 (six)  hours as needed for mild pain.    [provider]  ?levocetirizine (XYZAL) 5 MG tablet Take 5 mg by mouth daily as needed for allergies.    [provider]  ?oxyCODONE-acetaminophen (PERCOCET/ROXICET) 5-325 MG tablet Take 1-2 tablets by mouth every 6 (six) hours as needed (for moderate pain). 04/21/21   Allred, Shirlyn Goltz, PA-C  ?promethazine (PHENERGAN) 25 MG tablet Take 1 tablet (25 mg total) by mouth every 8 (eight) hours as needed for nausea. 04/21/21   Allred, Shirlyn Goltz, PA-C  ?  ? ?Family History  ?Problem Relation Age of Onset  ? Hypertension Mother   ? Cancer Mother   ?     breast   ? Diabetes Father   ? ? ?Social History  ? ?Socioeconomic History  ? Marital status: Single  ?  Spouse name: Not on file  ? Number of children: Not on file  ? Years of education: Not on file  ? Highest education level: Not on file  ?Occupational History  ? Not on file  ?Tobacco Use  ? Smoking status: Every Day  ?  Packs/day: 0.25  ?  Years: 9.00  ?  Pack years: 2.25  ?  Types: Cigarettes  ? Smokeless tobacco: Never  ?Vaping Use  ? Vaping Use: Never used  ?Substance and Sexual Activity  ? Alcohol use: Yes  ?  Alcohol/week: 2.0 standard drinks  ?  Types:  2 Shots of liquor per week  ? Drug use: Never  ? Sexual activity: Not on file  ?Other Topics Concern  ? Not on file  ?Social History Narrative  ? Not on file  ? ?Social Determinants of Health  ? ?Financial Resource Strain: Not on file  ?Food Insecurity: Not on file  ?Transportation Needs: Not on file  ?Physical Activity: Not on file  ?Stress: Not on file  ?Social Connections: Not on file  ? ? ?Review of Systems  ?Gastrointestinal:  Negative for constipation.  ?Genitourinary:  Negative for difficulty urinating and menstrual problem.  ? ? ?Physical Exam ?No direct physical exam was performed  ? ?Vital Signs: ?There were no vitals taken for this visit. ? ?Imaging: ?MR PELVIS W WO CONTRAST ? ?Result Date: 02/19/2022 ?CLINICAL DATA:  Post uterine artery embolization  fibroids EXAM: MRI PELVIS WITHOUT AND WITH CONTRAST TECHNIQUE: Multiplanar multisequence MR imaging of the pelvis was performed both before and after administration of intravenous contrast. CONTRAST:  29mL GADAVIST GADOBUTROL 1 MMOL/ML IV SOLN COMPARISON:  03/13/2021 FINDINGS: Urinary Tract:  No abnormality visualized. Bowel:  Unremarkable visualized pelvic bowel loops. Vascular/Lymphatic: No pathologically enlarged lymph nodes. No significant vascular abnormality seen. Reproductive: Significant interval decrease in size of fibroid uterus, measuring 9.9 x 4.8 x 5.9 cm, previously 11.2 x 7.0 x 8.1 cm (series 6, image 23, series 4, image 23). The largest fibroid of the posterior uterine body now measures 1.5 cm, previously 2.5 cm. A previously noted submucosal fibroid of the anterior uterine body is decreased in size, now measuring 0.6 cm, previously 1.5 cm. The majority of fibroids are now nonenhancing following embolization. Normal appearance of the ovaries with multiple small bilateral follicles. Other:  None. Musculoskeletal: No suspicious bone lesions identified. IMPRESSION: Significant interval decrease in size of both the fibroid uterus and individual fibroids following uterine artery embolization, with expected post embolization change. Electronically Signed   By: Delanna Ahmadi M.D.   On: 02/19/2022 08:22   ? ?Labs: ? ?CBC: ?Recent Labs  ?  04/20/21 ?1136  ?WBC 4.6  ?HGB 10.6*  ?HCT 33.6*  ?PLT 475*  ? ? ?COAGS: ?Recent Labs  ?  04/20/21 ?1036  ?INR 0.9  ? ? ?BMP: ?Recent Labs  ?  04/20/21 ?1036  ?NA 136  ?K 4.0  ?CL 104  ?CO2 23  ?GLUCOSE 91  ?BUN 17  ?CALCIUM 9.2  ?CREATININE 0.66  ?GFRNONAA >60  ? ? ?LIVER FUNCTION TESTS: ?No results for input(s): BILITOT, AST, ALT, ALKPHOS, PROT, ALBUMIN in the last 8760 hours. ? ?TUMOR MARKERS: ?No results for input(s): AFPTM, CEA, CA199, CHROMGRNA in the last 8760 hours. ? ?Assessment and Plan: ? ?46 year old with history of symptomatic uterine fibroids and status post  uterine artery embolization procedure on 04/20/2021.  Patient has had a very good outcome from the procedure and reports minimal menstrual symptoms at this time.  Her menstrual bleeding has markedly decreased and only has 1 to 2 days of light bleeding and reports minimal cramping which is a significant change.  MRI findings correlate with the good clinical outcome.  The uterus has slightly decreased in size and the dominant uterine fibroids have decreased in size without enhancement.  In particular, patient had a prominent submucosal fibroid which has decreased in size and may have been contributing to many of the patient's symptoms.  No complicating features of the embolization based on the MRI.  Recommend continued follow-up with her gynecologist, Dr. Garwin Brothers.  Patient can follow-up with Interventional Radiology as  needed. ? ? ?Electronically Signed: ?Burman Riis ?02/23/2022, 8:37 AM ? ? ?I spent a total of    10 Minutes in remote  clinical consultation, greater than 50% of which was counseling/coordinating care for uterine fibroids.   ? ?Visit type: Audio only (telephone). Audio (no video) only due to patient preference. ?Alternative for in-person consultation at [facility], [address] [place], [place], [place]. ?This visit type was conducted due to national recommendations for restrictions regarding the COVID-19 Pandemic (e.g. social distancing).  This format is felt to be most appropriate for this patient at this time.  All issues noted in this document were discussed and addressed.   Patient ID: LINSEY ARTEAGA, female   DOB: January 07, 1976, 46 y.o.   MRN: [id number] ? ?

## 2022-10-11 DIAGNOSIS — Z1231 Encounter for screening mammogram for malignant neoplasm of breast: Secondary | ICD-10-CM | POA: Diagnosis not present

## 2022-10-16 IMAGING — XA IR EMBO TUMOR ORGAN ISCHEMIA INFARCT INC GUIDE ROADMAPPING
10 of 20 series · 10 of 24 positions shown · IV contrast (IODINE)
Comparison: none

Addendum:
INDICATION: 44-year-old with uterine fibroids and menorrhagia. Patient is a
candidate for uterine artery embolization procedure.

[Series 2: care body 4 · 1 of 19 frames shown (1 of 9)]
[frame 3/19]
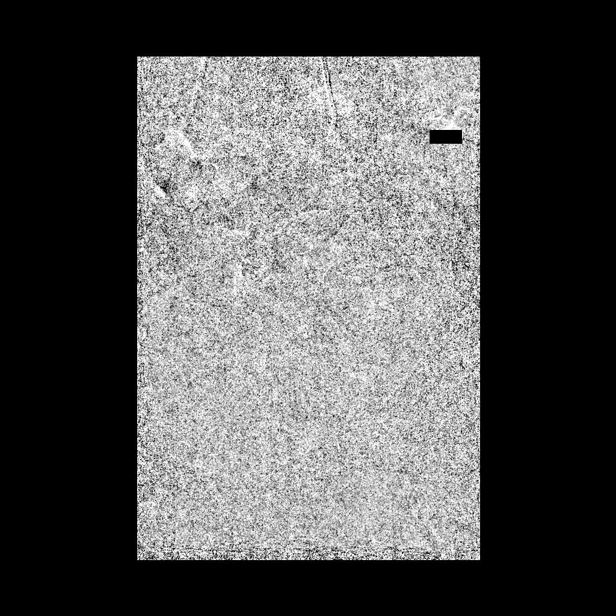

[Series 4: care body 4 · 1 of 18 frames shown (2 of 9)]
[frame 3/18]
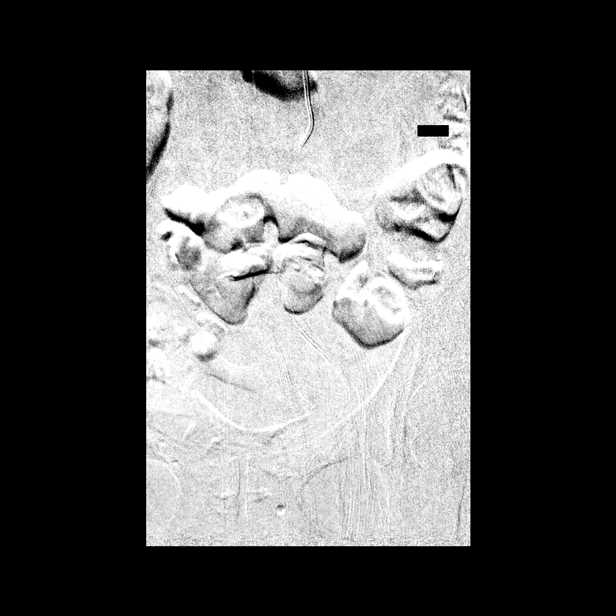

[Series 6: care body 4 · 1 of 18 frames shown (3 of 9)]
[frame 16/18]
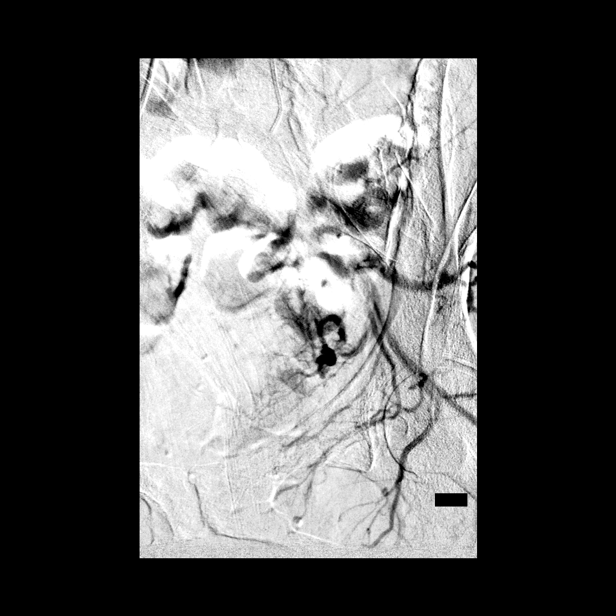

[Series 8: care body 4 · 1 of 27 frames shown (4 of 9)]
[frame 19/27]
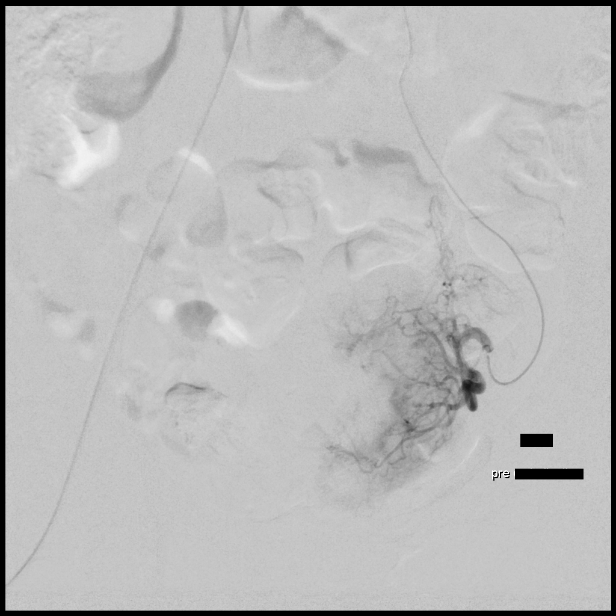

[Series 11: fl - angio · 1 of 37 frames shown]
[frame 7/37]
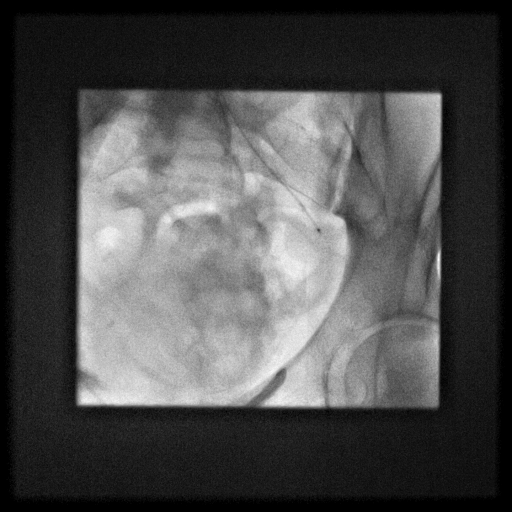

[Series 14: care body 4 · 1 of 21 frames shown (5 of 9)]
[frame 11/21]
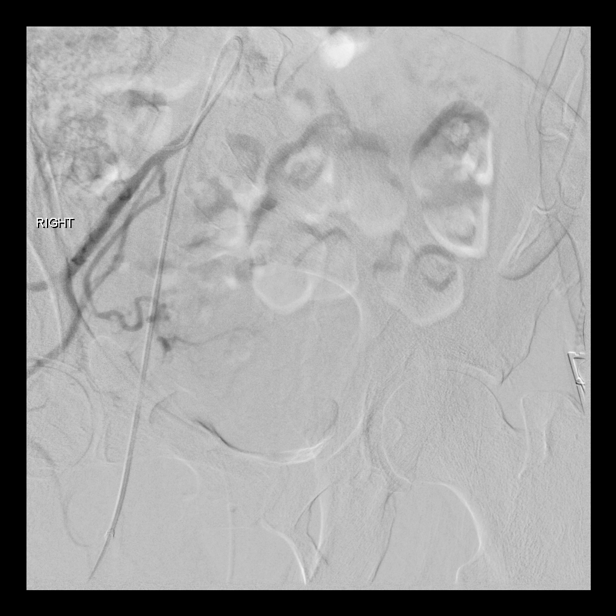

[Series 17: care body 4 · 1 of 20 frames shown (6 of 9)]
[frame 4/20]
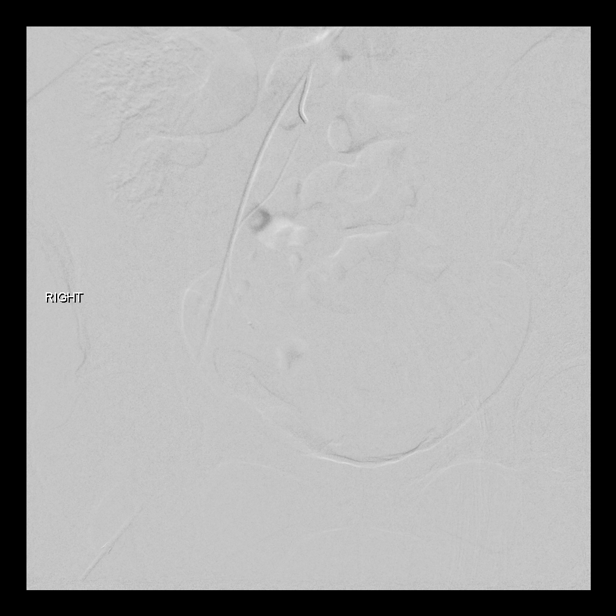

[Series 19: care body 4 · 1 of 21 frames shown (7 of 9)]
[frame 21/21]
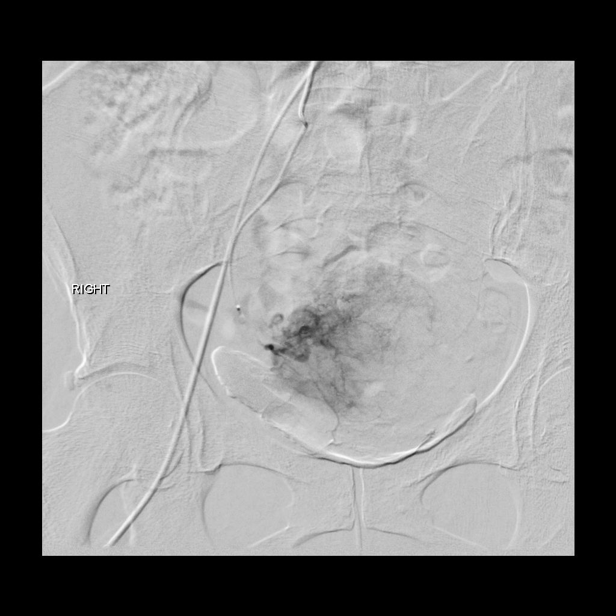

[Series 21: care body 4 · 1 of 16 frames shown (8 of 9)]
[frame 15/16]
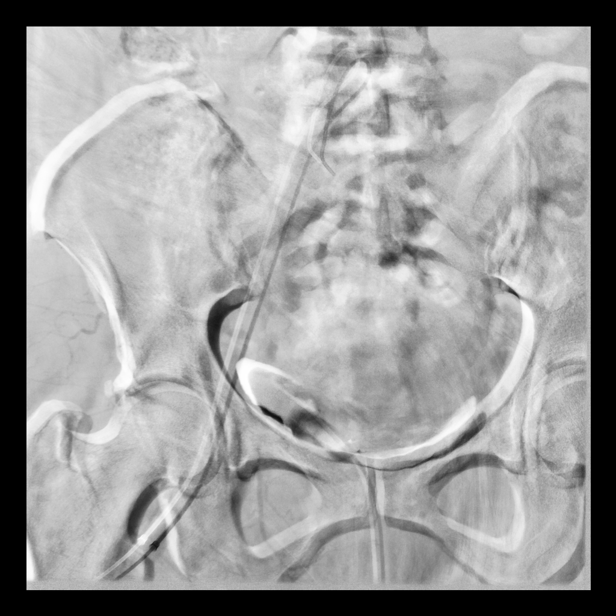

[Series 23: care body 4 · 1 of 19 frames shown (9 of 9)]
[frame 13/19]
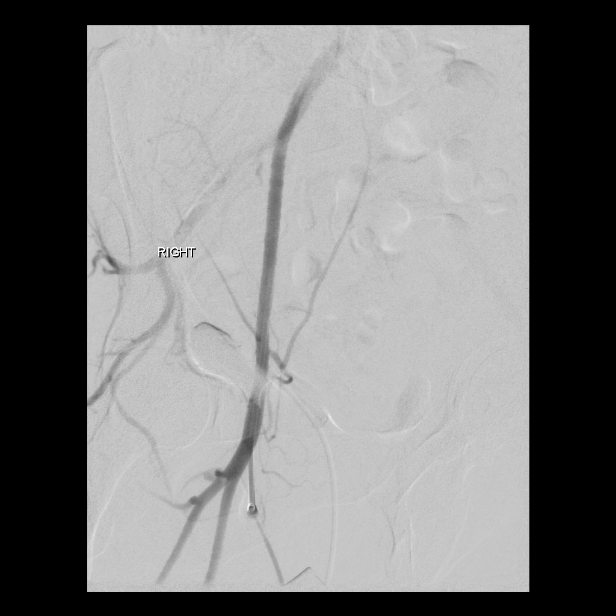

[10 of 24 positions shown; findings below may reference images not displayed]

EXAM:
BILATERAL UTERINE ARTERY EMBOLIZATION

PELVIC ANGIOGRAPHY: BILATERAL INTERNAL ILIAC ARTERIES AND BILATERAL
UTERINE ARTERIES

ULTRASOUND GUIDANCE FOR VASCULAR ACCESS X 2

MEDICATIONS:
Ancef 2 g. The antibiotic was administered within 1 hour of the
procedure.

Decadron 8 mg

ANESTHESIA/SEDATION:
Fentanyl 300 mcg IV; Versed 8 mg IV

Moderate Sedation Time:

The patient was continuously monitored during the procedure by the
interventional radiology nurse under my direct supervision.

CONTRAST:  100 mL Omnipaque 300

FLUOROSCOPY TIME:  Fluoroscopy Time: 13 minutes, 54 seconds, 481 mGy

COMPLICATIONS:
None immediate.



The patient was placed supine on the interventional table. We were
initially planning on left radial artery access. The left renal
artery was patent by ultrasound. Ultrasound image was saved for
documentation. Left wrist was prepped and draped in sterile fashion.
Skin was anesthetized using 1% lidocaine. Needle was directed into
the radial artery using ultrasound guidance. Wire would not easily
advanced. Ultrasound demonstrated that the radial artery was likely
and spasm in the lumen was very small. Needle was completely removed
and manual compression was placed. No active bleeding. Focal
narrowing left radial artery thought to be related to spasm and this
was persisting after approximately 5 minutes. Therefore, left radial
artery access was abandoned.

The right groin was prepped and draped in a sterile fashion. Maximal
barrier sterile technique was utilized including caps, mask, sterile
gowns, sterile gloves, sterile drape, hand hygiene and skin
antiseptic. The skin was anesthetized 1% lidocaine. Ultrasound
confirmed a patent right common femoral artery. Ultrasound image was
saved for documentation. Using ultrasound guidance, 21 gauge needle
was directed in the right common femoral artery and a micropuncture
dilator set was placed. The vascular access was upsized to a
5-French vascular sheath. A Cobra catheter was used to cannulate the
left common iliac artery and the left internal iliac artery. A
series of internal iliac arteriograms were performed to identify the
left uterine artery orfice. A high-flow Renegade microcatheter was
advanced into the left uterine artery. One vial of Embospheres (500
- 700 micron) were injected through the microcatheter with
fluoroscopic guidance. There was near complete stasis of the left
uterine artery following administration of the particles.
Arteriogram was performed in the left internal iliac artery
following the left uterine artery embolization.

Florentinaa Helbink loop was formed using the Cobra catheter and the right
internal iliac artery was cannulated. Arteriograms in the right
internal iliac artery were performed to identify the right uterine
artery orifice. The microcatheter was advanced into the right
uterine artery and a series of angiograms were performed. One vial
of Embosphere (500-700 micron) were injected through the
microcatheter with fluoroscopic guidance. There was near complete
stasis of the right uterine artery at the end of the procedure. The
microcatheter was removed. Arteriogram was performed in the right
internal iliac artery following embolization. The Cobra catheter was
straightened out over the aortic bifurcation and removed over a
wire. Angiogram was performed through the right groin vascular
sheath. The vascular sheath was removed using an ExoSeal closure
device.
FINDINGS: Wire would not easily advance into the left radial artery. There was
narrowing of the left radial artery lumen following attempted
placement of a wire. Therefore, the left radial artery access was
abandoned. At the end the procedure, left radial artery was widely
patent by ultrasound.

Prominent bilateral uterine arteries were identified. Near stasis in
bilateral uterine arteries following particle embolization.
IMPRESSION: Successful bilateral uterine artery embolization procedure.

ADDENDUM:
Sedation time: 1 hour and 50 minutes

*** End of Addendum ***
EXAM:
BILATERAL UTERINE ARTERY EMBOLIZATION

PELVIC ANGIOGRAPHY: BILATERAL INTERNAL ILIAC ARTERIES AND BILATERAL
UTERINE ARTERIES

ULTRASOUND GUIDANCE FOR VASCULAR ACCESS X 2

MEDICATIONS:
Ancef 2 g. The antibiotic was administered within 1 hour of the
procedure.

Decadron 8 mg

ANESTHESIA/SEDATION:
Fentanyl 300 mcg IV; Versed 8 mg IV

Moderate Sedation Time:

The patient was continuously monitored during the procedure by the
interventional radiology nurse under my direct supervision.

CONTRAST:  100 mL Omnipaque 300

FLUOROSCOPY TIME:  Fluoroscopy Time: 13 minutes, 54 seconds, 481 mGy

COMPLICATIONS:
None immediate.



The patient was placed supine on the interventional table. We were
initially planning on left radial artery access. The left renal
artery was patent by ultrasound. Ultrasound image was saved for
documentation. Left wrist was prepped and draped in sterile fashion.
Skin was anesthetized using 1% lidocaine. Needle was directed into
the radial artery using ultrasound guidance. Wire would not easily
advanced. Ultrasound demonstrated that the radial artery was likely
and spasm in the lumen was very small. Needle was completely removed
and manual compression was placed. No active bleeding. Focal
narrowing left radial artery thought to be related to spasm and this
was persisting after approximately 5 minutes. Therefore, left radial
artery access was abandoned.

The right groin was prepped and draped in a sterile fashion. Maximal
barrier sterile technique was utilized including caps, mask, sterile
gowns, sterile gloves, sterile drape, hand hygiene and skin
antiseptic. The skin was anesthetized 1% lidocaine. Ultrasound
confirmed a patent right common femoral artery. Ultrasound image was
saved for documentation. Using ultrasound guidance, 21 gauge needle
was directed in the right common femoral artery and a micropuncture
dilator set was placed. The vascular access was upsized to a
5-French vascular sheath. A Cobra catheter was used to cannulate the
left common iliac artery and the left internal iliac artery. A
series of internal iliac arteriograms were performed to identify the
left uterine artery orfice. A high-flow Renegade microcatheter was
advanced into the left uterine artery. One vial of Embospheres (500
- 700 micron) were injected through the microcatheter with
fluoroscopic guidance. There was near complete stasis of the left
uterine artery following administration of the particles.
Arteriogram was performed in the left internal iliac artery
following the left uterine artery embolization.

Florentinaa Helbink loop was formed using the Cobra catheter and the right
internal iliac artery was cannulated. Arteriograms in the right
internal iliac artery were performed to identify the right uterine
artery orifice. The microcatheter was advanced into the right
uterine artery and a series of angiograms were performed. One vial
of Embosphere (500-700 micron) were injected through the
microcatheter with fluoroscopic guidance. There was near complete
stasis of the right uterine artery at the end of the procedure. The
microcatheter was removed. Arteriogram was performed in the right
internal iliac artery following embolization. The Cobra catheter was
straightened out over the aortic bifurcation and removed over a
wire. Angiogram was performed through the right groin vascular
sheath. The vascular sheath was removed using an ExoSeal closure
device.
FINDINGS: Wire would not easily advance into the left radial artery. There was
narrowing of the left radial artery lumen following attempted
placement of a wire. Therefore, the left radial artery access was
abandoned. At the end the procedure, left radial artery was widely
patent by ultrasound.

Prominent bilateral uterine arteries were identified. Near stasis in
bilateral uterine arteries following particle embolization.
IMPRESSION: Successful bilateral uterine artery embolization procedure.

## 2023-02-22 DIAGNOSIS — J069 Acute upper respiratory infection, unspecified: Secondary | ICD-10-CM | POA: Diagnosis not present

## 2023-10-17 DIAGNOSIS — Z1231 Encounter for screening mammogram for malignant neoplasm of breast: Secondary | ICD-10-CM | POA: Diagnosis not present
